# Patient Record
Sex: Male | Born: 1950 | Race: White | Hispanic: No | Marital: Married | Smoking: Former smoker
Health system: Southern US, Community
[De-identification: ages and names within clinical notes are randomized; demographics above are authoritative.]

## PROBLEM LIST (undated history)

## (undated) DIAGNOSIS — Z8639 Personal history of other endocrine, nutritional and metabolic disease: Secondary | ICD-10-CM

## (undated) DIAGNOSIS — T7840XA Allergy, unspecified, initial encounter: Secondary | ICD-10-CM

## (undated) DIAGNOSIS — I480 Paroxysmal atrial fibrillation: Secondary | ICD-10-CM

## (undated) HISTORY — PX: CARDIAC CATHETERIZATION: SHX172

## (undated) HISTORY — PX: TEE WITH CARDIOVERSION: SHX5442

## (undated) HISTORY — DX: Allergy, unspecified, initial encounter: T78.40XA

## (undated) HISTORY — DX: Paroxysmal atrial fibrillation: I48.0

## (undated) HISTORY — DX: Personal history of other endocrine, nutritional and metabolic disease: Z86.39

---

## 2007-12-07 DIAGNOSIS — I480 Paroxysmal atrial fibrillation: Secondary | ICD-10-CM

## 2007-12-07 HISTORY — DX: Paroxysmal atrial fibrillation: I48.0

## 2008-02-21 ENCOUNTER — Inpatient Hospital Stay (HOSPITAL_COMMUNITY): Admission: EM | Admit: 2008-02-21 | Discharge: 2008-02-22 | Payer: Self-pay | Admitting: Emergency Medicine

## 2008-02-22 ENCOUNTER — Encounter (INDEPENDENT_AMBULATORY_CARE_PROVIDER_SITE_OTHER): Payer: Self-pay | Admitting: *Deleted

## 2009-04-17 IMAGING — CR DG CHEST 2V
2 series · 2 of 2 positions shown · non-contrast
Comparison: none

CLINICAL DATA: Chest pain.  Dizziness.  Nonsmoker. 
 CHEST - 2 VIEW:

[w chest pa]
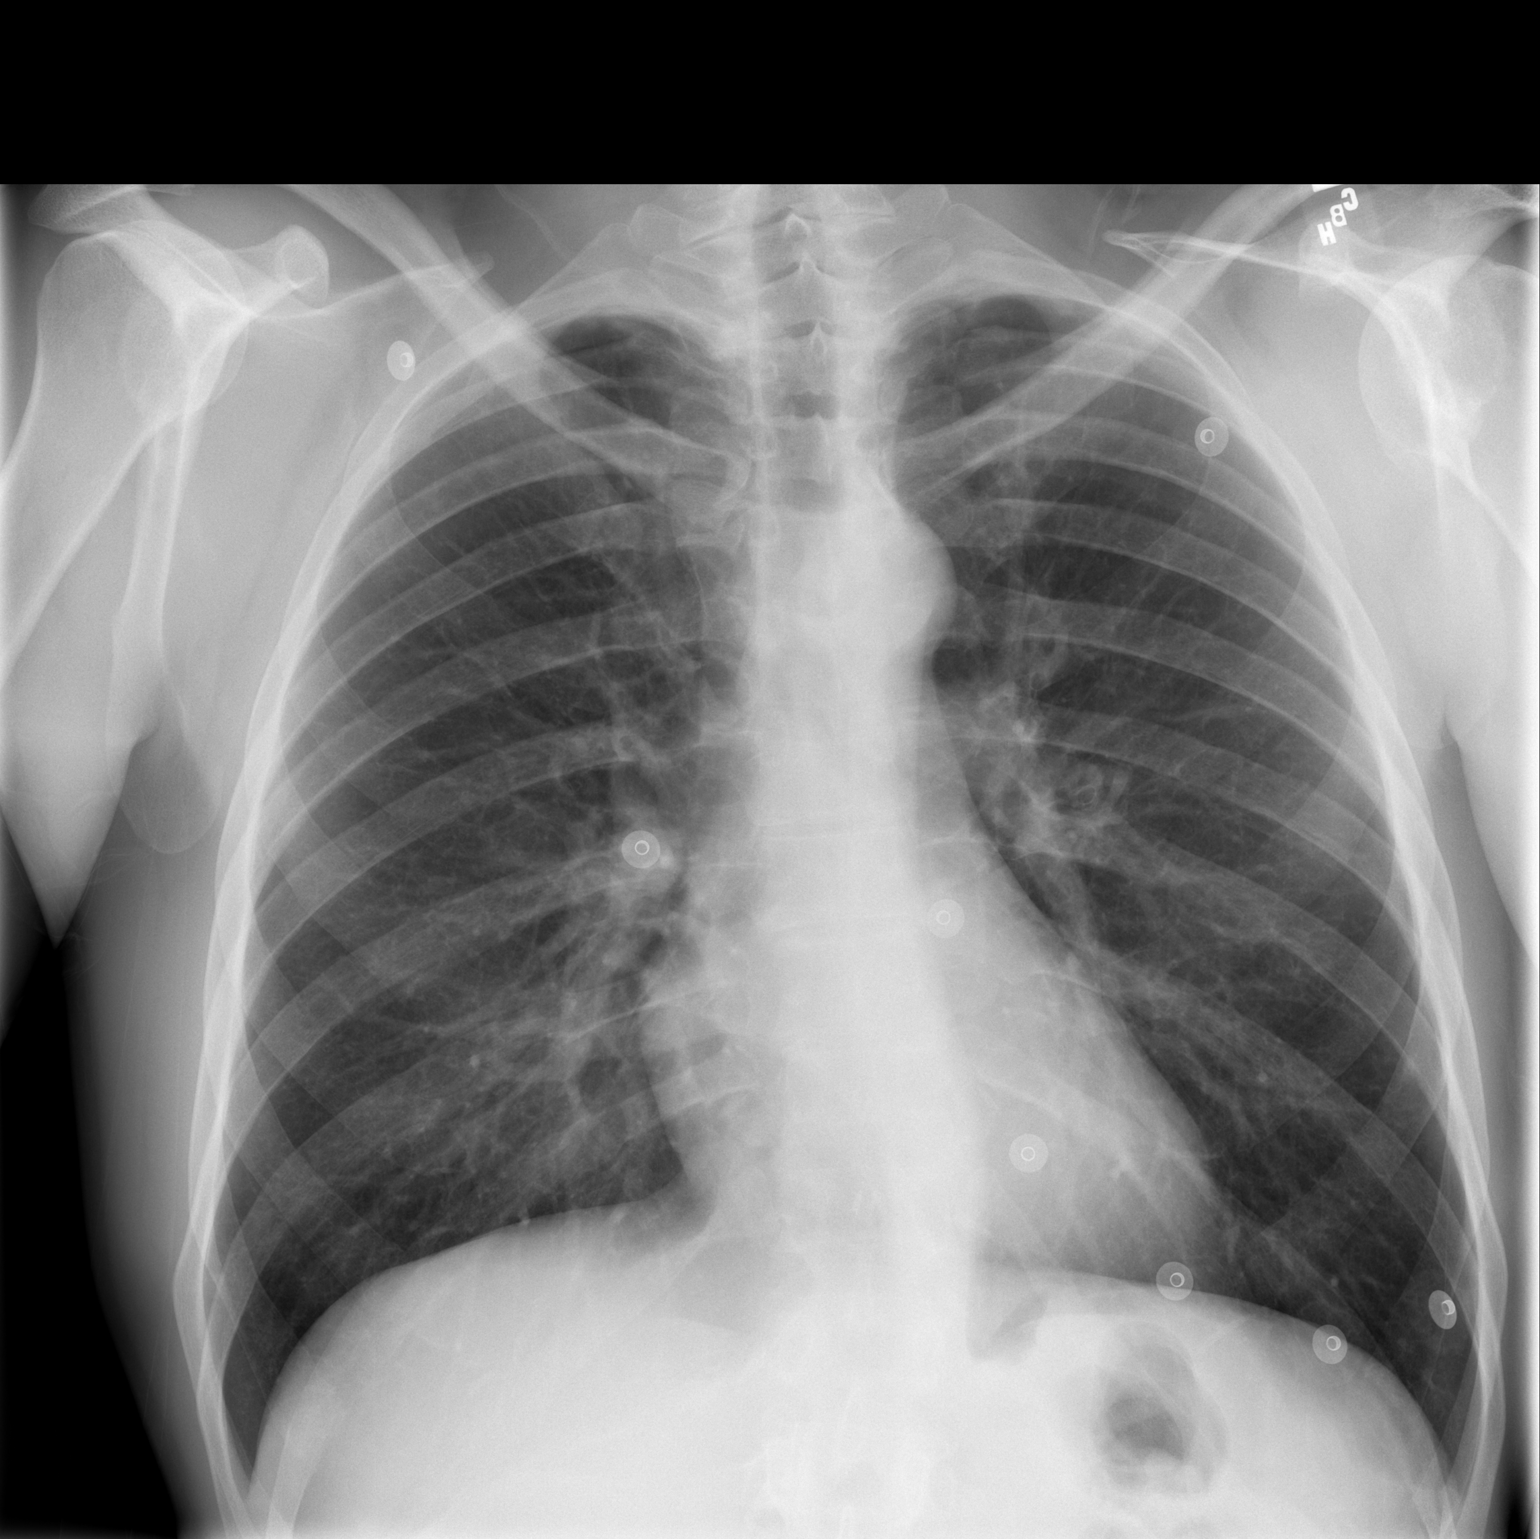

[w chest lat]
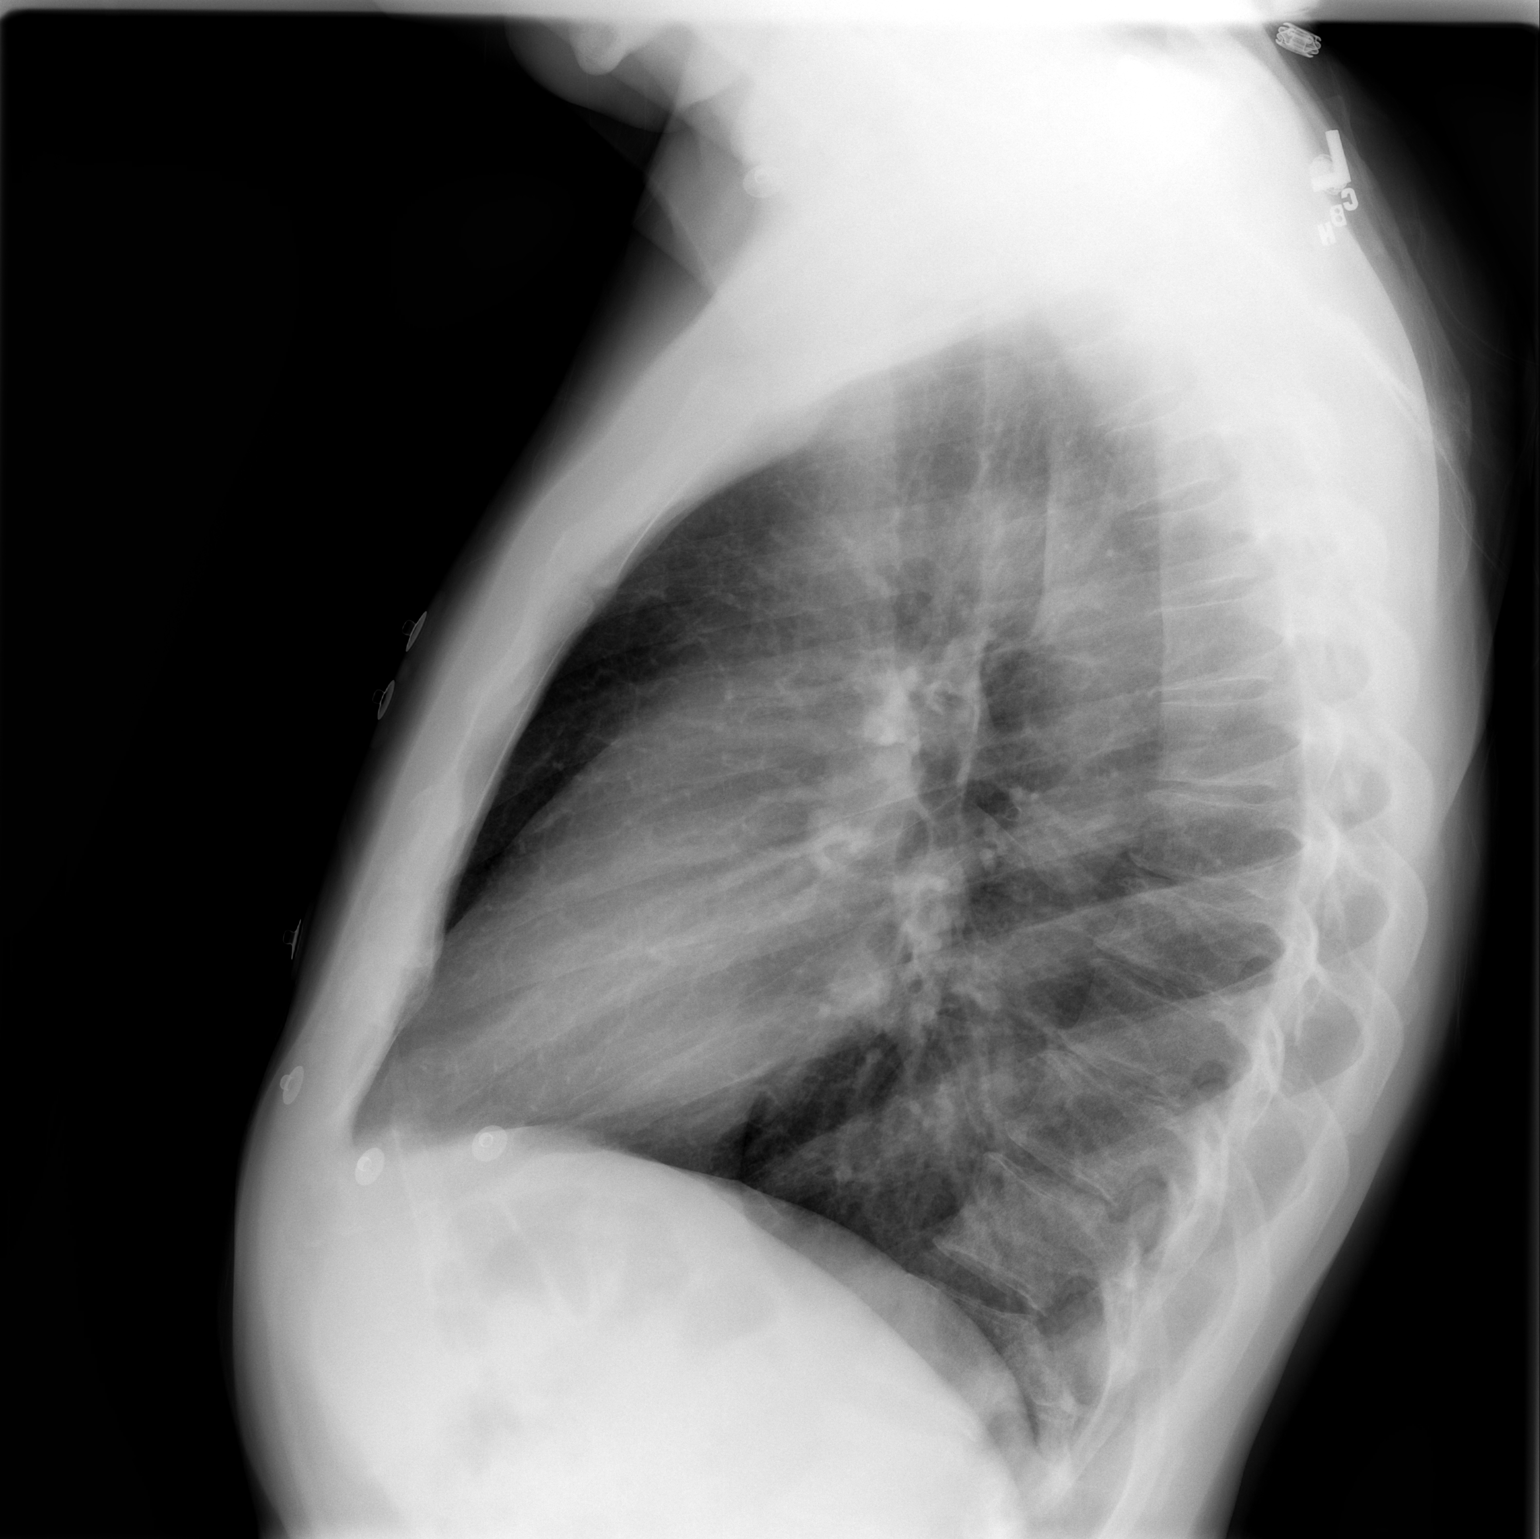

[2 of 2 positions shown; findings below may reference images not displayed]

FINDINGS: The heart size and mediastinal contours are within normal limits.  Both lungs are clear.  The visualized skeletal structures are unremarkable.
IMPRESSION: No active cardiopulmonary disease.

## 2009-04-17 IMAGING — CT CT HEAD W/O CM
1 series · 16 of 30 positions shown, 20 images · IV contrast (agent unspecified)
Comparison: None.

CLINICAL DATA: Dizziness and near syncopal episode.
 HEAD CT WITHOUT CONTRAST:
TECHNIQUE: Contiguous axial images were obtained from the base of the skull through the vertex according to standard protocol without contrast.

[Series 2: head routine 4.8 h37s · axial · 0.45mm/px · z∈[+1143,+1300]mm · 16 of 36 slices shown, 20 images]
[im 2/36  brain]
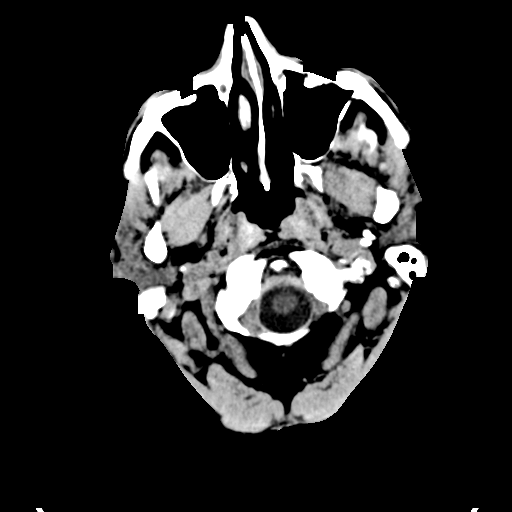
[im 2/36  bone]
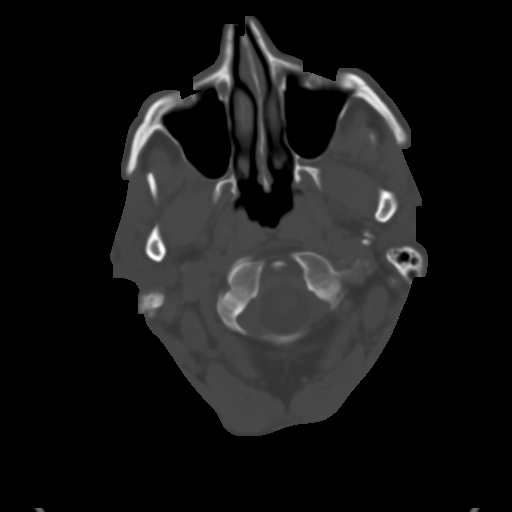
[im 4/36  brain]
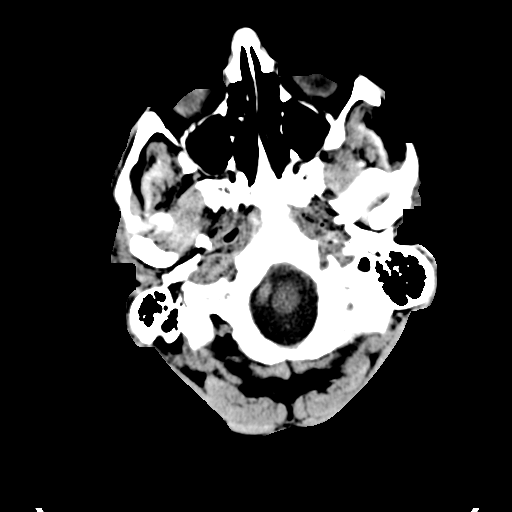
[im 7/36  brain]
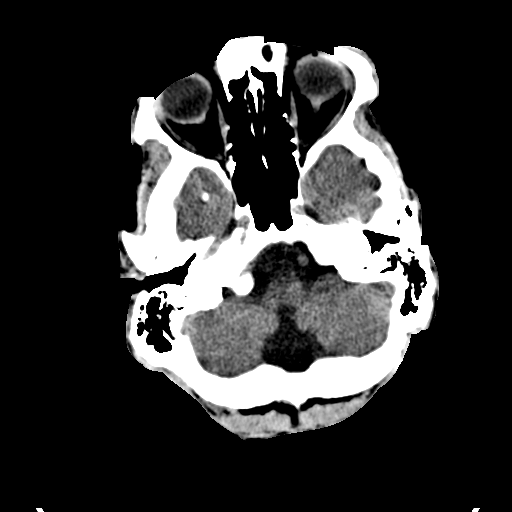
[im 9/36  brain]
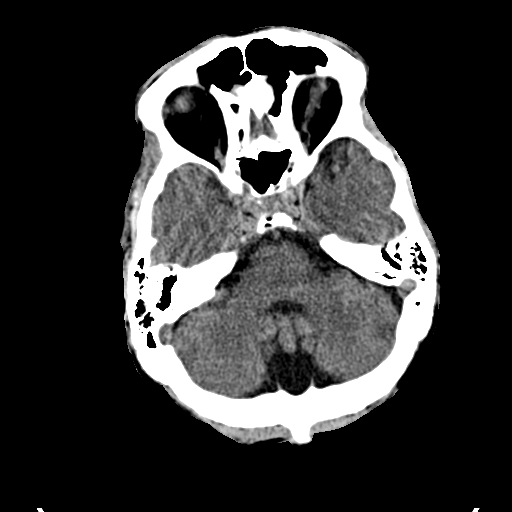
[im 10/36  brain]
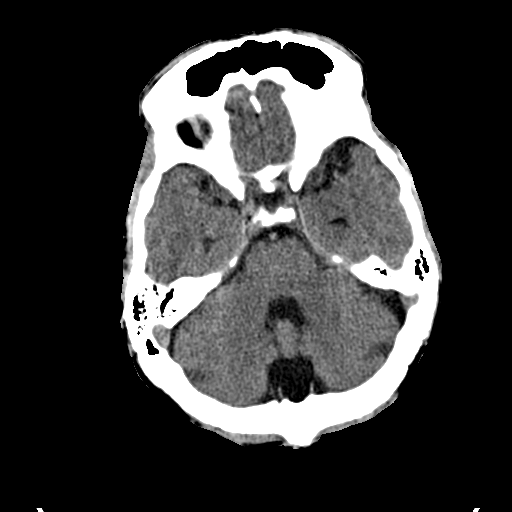
[im 10/36  bone]
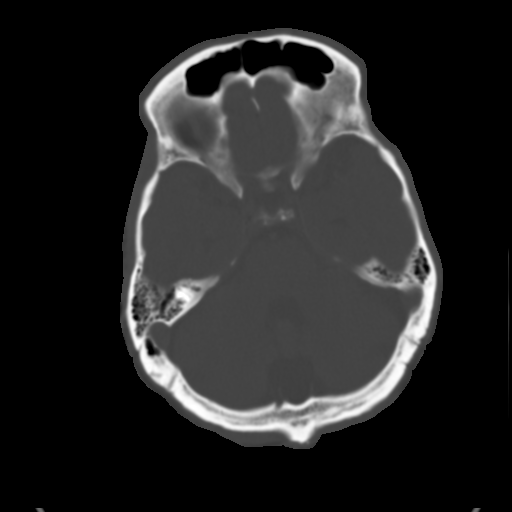
[im 13/36  brain]
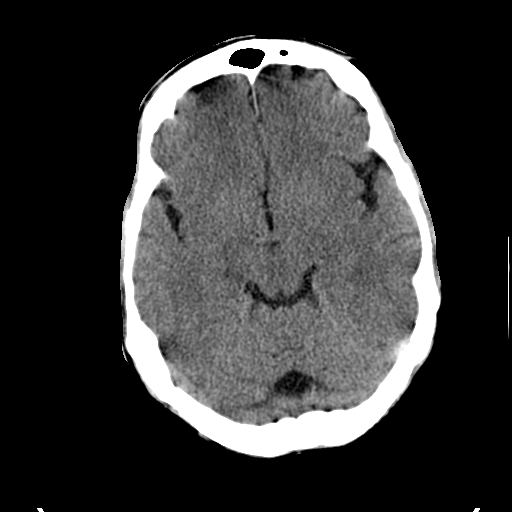
[im 15/36  brain]
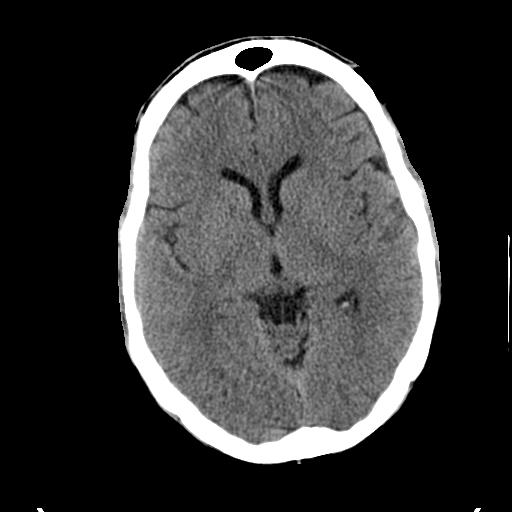
[im 17/36  brain]
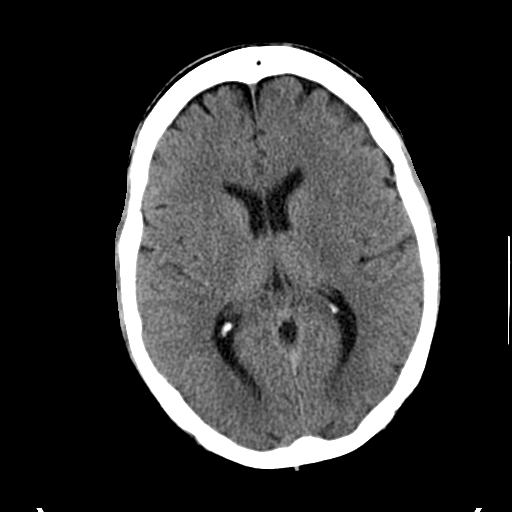
[im 19/36  brain]
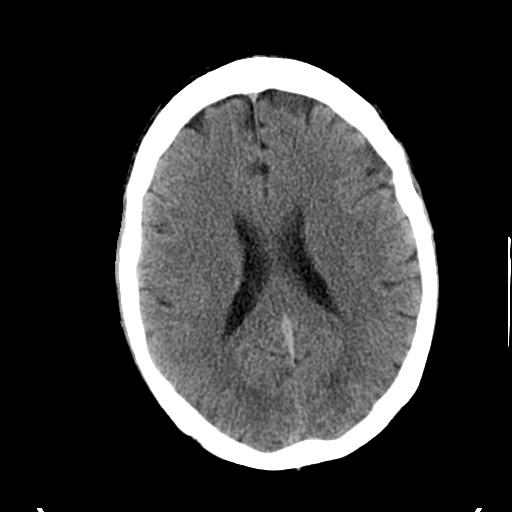
[im 19/36  bone]
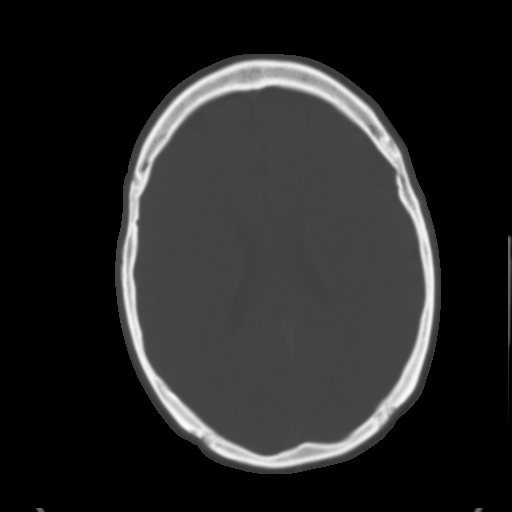
[im 21/36  brain]
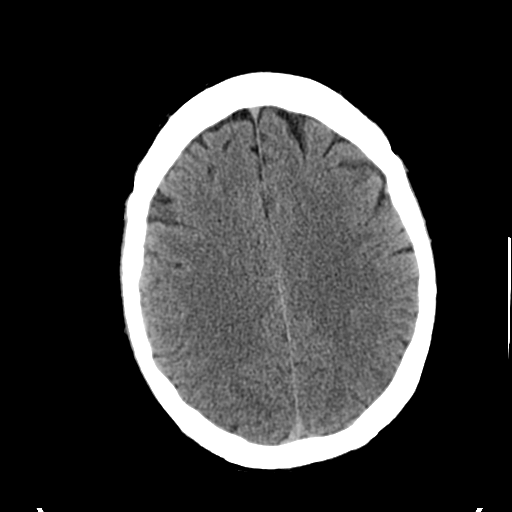
[im 23/36  brain]
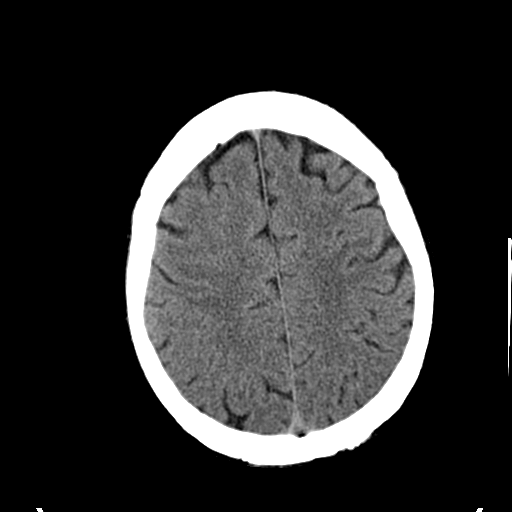
[im 26/36  brain]
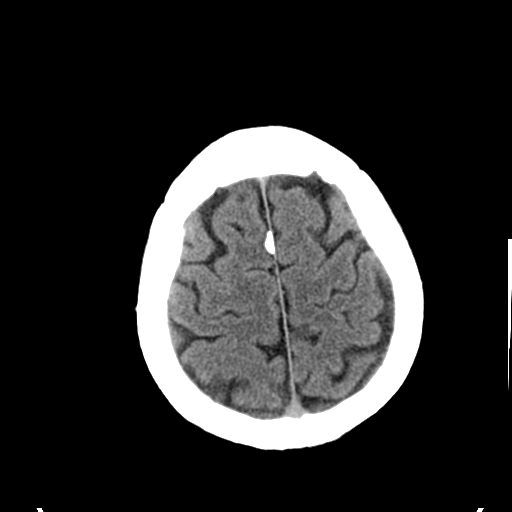
[im 27/36  brain]
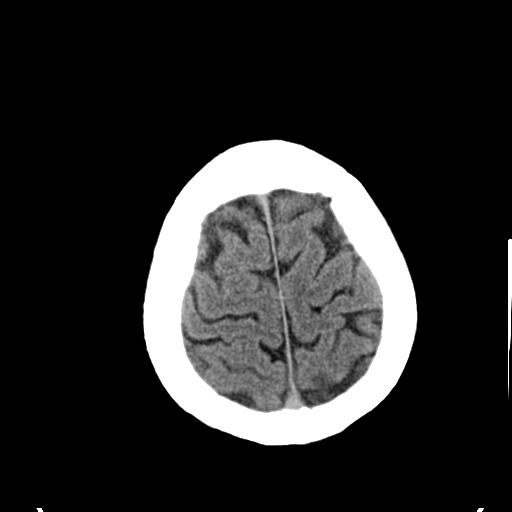
[im 27/36  bone]
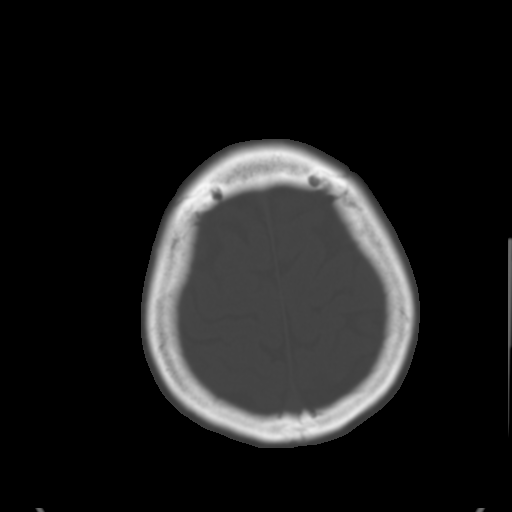
[im 29/36  brain]
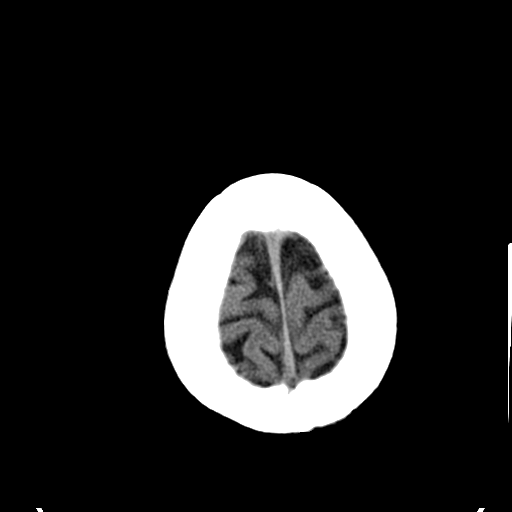
[im 32/36  brain]
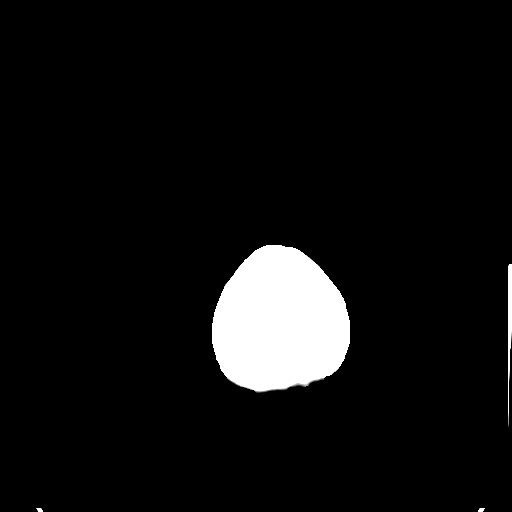
[im 34/36  brain]
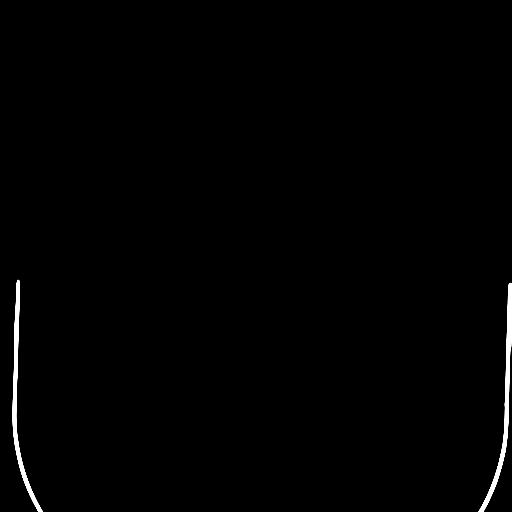

[16 of 30 positions shown; findings below may reference images not displayed]

FINDINGS: There is prominence of the sulci and ventricles consistent with brain atrophy.
 No abnormal extraaxial fluid collection, intracranial hemorrhage, or mass. 
 There are no specific features of acute infarct or hemorrhage.
 No masses or abnormal fluid collections noted.
 The paranasal sinuses and mastoid air cells are all normally aerated.
 A left frontal scalp lipoma is identified.
IMPRESSION: 1.  No acute intracranial abnormalities.
 2.  Brain atrophy.
 3.  Left frontal scalp lipoma.

## 2011-04-20 NOTE — Cardiovascular Report (Signed)
Darren Pugh, Darren Pugh                ACCOUNT NO.:  [id number]   MEDICAL RECORD NO.:  [id number]          PATIENT TYPE:  INP   LOCATION:  3735                         FACILITY:  [facility]   PHYSICIAN:  Antonieta Iba, MD   DATE OF BIRTH:  Oct 31, 1951   DATE OF PROCEDURE:  02/22/2008  DATE OF DISCHARGE:  02/22/2008                            CARDIAC CATHETERIZATION   PROCEDURES PERFORMED:  1. Left heart catheterization.  2. Coronary angiogram.   PHYSICIAN:  Antonieta Iba, MD   HISTORY:  Mr. Darren Pugh is a 60 year old gentleman with has a past  medical history of coronary artery disease presents to the [facility] for rapid atrial fibrillation, chest pain, troponin elevation  to 0.25 and periods of lightheadedness and dizziness in a setting of  atrial fibrillation.  He states that this has been going on for the past  2 days.  He last had these symptoms 1 year ago during the summer time  though without the chest discomfort.  He was brought for cardiac  catheterization for further evaluation of coronary disease.   MEDICATIONS GIVEN:  Fentanyl 25 mcg x1, Versed 1 mg IV x1.   PROCEDURE IN DETAIL:  After detailed description of the procedure,  including the risks, benefits and potential alternatives informed  consent was obtained.  The patient was premedicated with Valium 5 mg  p.o. and Benadryl 25 mg p.o. and was brought to the cardiac  catheterization lab.  The patient was prepped and draped in the usual  sterile fashion.   Local anesthesia was obtained using 1% lidocaine.  The right femoral  artery was cannulated using a modified Seldinger technique and a 6  French introducer sheath was inserted.   Selective coronary angiography was then performed using a 6 French  Judkins left #4 catheter to engage the left main coronary artery and a 6  French Judkins right #4 catheter to engage the right coronary artery.  Arteriography was performed using hand injections of contrast  in  multiple projections.  Left ventriculography was not performed.  Following the procedure the arterial sheath was removed and hemostasis  obtained.  Less than 60 mL of contrast was used for the procedure.   ANGIOGRAPHY:  1. Left main coronary artery; the left main is a moderate sized vessel      that bifurcates into the LAD (left anterior descending) and the      left circumflex.  There is no significant disease.  2. Left anterior descending; the left anterior descending is a      moderate sized vessel that has two diagonal branches.  There is 20-      30% mid LAD disease though myocardial bridging cannot be ruled out      at this location.  Otherwise, there is no significant disease of      the LAD or diagonal vessels.  3. Left circumflex artery; the left circumflex is a dominant vessel of      moderate size giving rise to large branching obtuse marginal artery      and PDA.  There is no  significant disease noted.  4. Right coronary artery; the right coronary artery is a nondominant      vessel with no significant disease noted.   CONCLUSION:  Left dominant coronary system with mild mid left anterior  descending disease of 20-30% in a relatively focal region.  Myocardial  bridging at this location could not be ruled out.  Otherwise, there is  no significant disease noted.  The etiology of his chest pain and  troponin level is likely secondary to rapid atrial fibrillation.      Antonieta Iba, MD  Electronically Signed     TJG/MEDQ  D:  02/23/2008  T:  02/23/2008  Job:  [id number]

## 2011-04-20 NOTE — H&P (Signed)
Darren Pugh, Darren Pugh                ACCOUNT NO.:  [id number]   MEDICAL RECORD NO.:  [id number]          PATIENT TYPE:  INP   LOCATION:  3735                         FACILITY:  [facility]   PHYSICIAN:  Lucita Ferrara, MD         DATE OF BIRTH:  [birth year]   DATE OF ADMISSION:  02/21/2008  DATE OF DISCHARGE:                              HISTORY & PHYSICAL   HISTORY OF PRESENT ILLNESS:  The patient is a 60 year old male presents  to [facility] with a chief complaint of shortness of  breath, dizziness and palpitations which occurred times 24 hours.  Patient's symptoms are progressively worse.  The patient apparently  states that he also blacked out and saw white star.  At the time that  these symptoms occurred, the patient was exerting himself unloading a  truck.  He  also complains of left-sided type of chest pain that is  pressure-like in intensity.  He denies alcohol intake, denies a history  of arrhythmias or coronary artery disease or cardiac dysfunction.  Otherwise, past medical history is remarkable.  He did not have symptoms  associated with gastroesophageal reflux disease.  Chest pain is not  reproducible, not associated with food.  Dizziness and blacking out  resolved.  He denies any seizure activity.  He also denies any focal  neurological deficits or deterioration in his speech or garbled speech.   SURGERIES:  None.   MEDICATIONS:  None.   ALLERGIES:  Apparently is allergic to HYDROCODONE.  Gets abnormal  behavior.   He had never seen a cardiologist nor been evaluated by cardiologist.   SOCIAL HISTORY:  Denies drugs, alcohol or tobacco.   PHYSICAL EXAMINATION:  VITAL SIGNS:  Temperature 97, blood pressure  ranging from 95/66 to 156/113, pulse ranging from 67 to 176, pulse ox  96% on room air.  HEENT:  Normocephalic, atraumatic.  Sclerae anicteric.  NECK:  Supple.  No JVD.  No carotid bruits.  Extraocular muscles intact.  CARDIOVASCULAR:  S1 and S2.  Irregularly  irregular with rapid rate.  No  murmurs, rubs or clicks.  LUNGS:  Clear to auscultation bilaterally.  No rhonchi, rales or  wheezes.  EXTREMITIES:  No clubbing, cyanosis or edema.  NEURO:  Patient is alert, oriented x3.  Cranial nerves II-XII grossly  intact.   LABORATORY DATA:  BUN is 21, creatinine 1.2.  Hemoglobin is 14.8,  hematocrit 41.5, white count 9.4,  platelets 177,000.  In the emergency  room the patient was found to be in rapid ventricular rate atrial  fibrillation.  Was started on Cardizem.  EKG shows atrial fibrillation  with rapid ventricular response of 107 ventricular rate.   ASSESSMENT/PLAN:  A 60 year old with no significant past medical history  with;  1. Atrial fibrillation with a questionable rapid ventricular rate and      also pressure-like chest pain.  Will go ahead and admit the patient      to telemetry unit for monitoring.  Rule out ischemia or acute      coronary syndrome, cardiac enzymes x3 every 8 hours.  Will check an      magnesium phosphorus and thyroid-stimulating hormone.  A 2-D      echocardiogram in the morning.  Cardiology consultation as needed.      Start aspirin for now, 325 mg p.o. daily.  Rate is now controlled      with the Cardizem that was given in the emergency room.  We will      use Lopressor p.r.n. for rate control. Also get a chest x-ray now.  2. Hypertension.  That is a new diagnosis.  May be secondary to #1.      We will watch his blood pressures for now.  Control with      Metoprolol.  3. Deep venous thrombosis prophylaxis with Lovenox and GI prophylaxis      with Protonix.   I explained plans and procedures of this admission to the patient.  The  patient understands and the patient.      Lucita Ferrara, MD  Electronically Signed     RR/MEDQ  D:  02/21/2008  T:  02/22/2008  Job:  [id number]

## 2011-04-23 NOTE — Discharge Summary (Signed)
NAMEHENDERSON, FRAMPTON                ACCOUNT NO.:  [id number]   MEDICAL RECORD NO.:  [id number]          PATIENT TYPE:  INP   LOCATION:  3735                         FACILITY:  [facility]   PHYSICIAN:  Wilson Singer, M.D.DATE OF BIRTH:  11-22-1951   DATE OF ADMISSION:  02/21/2008  DATE OF DISCHARGE:  02/22/2008                               DISCHARGE SUMMARY   FINAL DISCHARGE DIAGNOSIS:  1. Paroxysmal atrial fibrillation.  2. Myocardial infarction excluded.  3. Cardiac catheterization for which the results do not seem to be in      the chart, to be managed conservatively.   CONDITION ON DISCHARGE:  Stable.   MEDICATIONS ON DISCHARGE:  1. Aspirin 325 mg daily.  2. Metoprolol 12.5 mg daily.   HISTORY:  This is a 60 year old man was admitted with shortness of  breath, dizziness, and palpitations.  Please see initial history and  physical examination done by Dr. Ancil Boozer.   HOSPITAL PROGRESS:  The patient was admitted to telemetry and he was  seen by cardiologist.  There was no evidence of myocardial infarction.  He underwent cardiac catheterization on  February 22, 2008, which showed  mild nonsignificant coronary artery disease with mild LAD stenosis of  30% or so.  He did well postprocedure and there were no major problems.  He was able to be discharged home.  He will follow up with his  cardiologist at [facility].      Wilson Singer, M.D.  Electronically Signed     NCG/MEDQ  D:  04/15/2008  T:  04/16/2008  Job:  [id number]

## 2011-08-30 LAB — BASIC METABOLIC PANEL
BUN: 20
CO2: 24
Chloride: 108
Creatinine, Ser: 0.99
Glucose, Bld: 110 — ABNORMAL HIGH
Potassium: 3.5

## 2011-08-30 LAB — RAPID URINE DRUG SCREEN, HOSP PERFORMED
Amphetamines: NOT DETECTED
Barbiturates: NOT DETECTED
Cocaine: NOT DETECTED
Opiates: NOT DETECTED
Tetrahydrocannabinol: NOT DETECTED

## 2011-08-30 LAB — POCT I-STAT CREATININE: Creatinine, Ser: 1.2

## 2011-08-30 LAB — I-STAT 8, (EC8 V) (CONVERTED LAB)
Bicarbonate: 24.9 — ABNORMAL HIGH
Chloride: 106
HCT: 46
Hemoglobin: 15.6
Operator id: 257131
TCO2: 26
pCO2, Ven: 41.9 — ABNORMAL LOW

## 2011-08-30 LAB — POCT CARDIAC MARKERS
CKMB, poc: <1 — ABNORMAL LOW
Operator id: 257131
Troponin i, poc: <0.05

## 2011-08-30 LAB — CK TOTAL AND CKMB (NOT AT ARMC): Relative Index: INVALID

## 2011-08-30 LAB — DIFFERENTIAL
Basophils Relative: 0
Eosinophils Absolute: 0
Eosinophils Relative: 0
Neutrophils Relative %: 89 — ABNORMAL HIGH

## 2011-08-30 LAB — CBC
HCT: 41.5
MCHC: 35.5
MCV: 95.7
Platelets: 177
RDW: 12.5

## 2011-08-30 LAB — LIPID PANEL
Cholesterol: 108
LDL Cholesterol: 74
Total CHOL/HDL Ratio: 4.5
VLDL: 10

## 2011-08-30 LAB — APTT: aPTT: 28

## 2011-08-30 LAB — MAGNESIUM: Magnesium: 1.8

## 2011-08-30 LAB — PROTIME-INR: INR: 1.1

## 2011-08-30 LAB — TROPONIN I: Troponin I: 0.19 — ABNORMAL HIGH

## 2011-08-30 LAB — CALCIUM: Calcium: 8.2 — ABNORMAL LOW

## 2012-05-30 ENCOUNTER — Encounter: Payer: Self-pay | Admitting: Cardiovascular Disease

## 2012-05-31 ENCOUNTER — Ambulatory Visit: Payer: Self-pay | Admitting: Cardiovascular Disease

## 2012-06-05 ENCOUNTER — Encounter: Payer: Self-pay | Admitting: Cardiovascular Disease

## 2012-06-05 ENCOUNTER — Ambulatory Visit (INDEPENDENT_AMBULATORY_CARE_PROVIDER_SITE_OTHER): Payer: BC Managed Care – PPO | Admitting: Cardiovascular Disease

## 2012-06-05 VITALS — BP 120/80 | HR 59 | Ht 75.0 in | Wt 191.0 lb

## 2012-06-05 DIAGNOSIS — I4891 Unspecified atrial fibrillation: Secondary | ICD-10-CM

## 2012-06-05 DIAGNOSIS — I480 Paroxysmal atrial fibrillation: Secondary | ICD-10-CM | POA: Insufficient documentation

## 2012-06-05 DIAGNOSIS — I2510 Atherosclerotic heart disease of native coronary artery without angina pectoris: Secondary | ICD-10-CM | POA: Insufficient documentation

## 2012-06-05 NOTE — Patient Instructions (Addendum)
You are doing well. No medication changes were made.  Please call us if you have new issues that need to be addressed before your next appt.  Your physician wants you to follow-up in: 12 months.  You will receive a reminder letter in the mail two months in advance. If you don't receive a letter, please call our office to schedule the follow-up appointment. 

## 2012-06-05 NOTE — Assessment & Plan Note (Signed)
Currently with no symptoms of angina. No further workup at this time. Continue current medication regimen. 
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
## 2012-06-05 NOTE — Assessment & Plan Note (Signed)
No indication of further episodes of atrial fibrillation.  clinically is doing well. Many findings on exam. No testing at this time .

## 2012-10-02 ENCOUNTER — Ambulatory Visit (INDEPENDENT_AMBULATORY_CARE_PROVIDER_SITE_OTHER): Payer: BC Managed Care – PPO | Admitting: Family Medicine

## 2012-10-02 VITALS — BP 138/86 | HR 66 | Temp 97.8°F | Resp 20 | Ht 74.0 in | Wt 193.6 lb

## 2012-10-02 DIAGNOSIS — Z0000 Encounter for general adult medical examination without abnormal findings: Secondary | ICD-10-CM

## 2012-10-02 NOTE — Progress Notes (Signed)
[facility] [address] [place] [address] [phone]  Date:  10/02/2012   Name:  Darren Pugh   DOB:  1951/09/28   MRN:  [id number]  PCP:  No primary provider on file.    Chief Complaint: Employment Physical   History of Present Illness:  Darren Pugh is a 61 y.o. very pleasant male patient who presents with the following:  History of A fib, CP and elevated troponin in the past.  He had a heart cath in 209 which showed mild CAD but he never had a stent or other procedure and was not diagnosed with an MI.  He spontaneously converted to sinus rhythm.  He recently saw Dr. Mariah Milling in July of this year to establish care and is being followed.   He is on metoprolol at this time. It is not clear if this is for BP control or cardioprotection.  Otherwise, his DOT history form is negative except he does drink about 6 beers a day and has done so for some time.   He has never had a DUI.  He does not need to drink during the day to steady his nerves.  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 PAF (paroxysmal atrial fibrillation)  . CAD (coronary artery disease)    Past Medical History  Diagnosis Date  . PAF (paroxysmal atrial fibrillation) 2009  . History of hyperlipidemia     Past Surgical History  Procedure Date  . Tee with cardioversion   . Cardiac catheterization     30%LAD;non-critical mid;normal dominant circumflex;normal nondominant right.    History  Substance Use Topics  . Smoking status: Former Smoker    Types: Cigars    Quit date: 10/02/1997  . Smokeless tobacco: Not on file  . Alcohol Use: No    Family History  Problem Relation Age of Onset  . Arrhythmia Mother     No Known Allergies  Medication list has been reviewed and updated.  Current Outpatient Prescriptions on File Prior  to Visit  Medication Sig Dispense Refill  . metoprolol tartrate (LOPRESSOR) 12.5 mg TABS Take 12.5 mg by mouth at bedtime.       . Multiple Vitamin (MULTIVITAMIN) tablet Take 1 tablet by mouth daily.      Marland Kitchen omeprazole (PRILOSEC) 20 MG capsule Take 20 mg by mouth as needed.        Review of Systems:  As per HPI- otherwise negative.   Physical Examination: Filed Vitals:   10/02/12 1226  BP: 154/100  Pulse: 66  Temp: 97.8 F (36.6 C)  Resp: 20   Filed Vitals:   10/02/12 1226  Height: 6\' 2"  (1.88 m)  Weight: 193 lb 9.6 oz (87.816 kg)   Body mass index is 24.86 kg/(m^2). Ideal Body Weight: Weight in (lb) to have BMI = 25: 194.3   GEN: WDWN, NAD, Non-toxic, A & O x 3- no sign of intoxication or alcohol withdrawal.  HEENT: Atraumatic, Normocephalic. Neck supple. No masses, No LAD.  Bilateral TM wnl, oropharynx normal.  PEERL,EOMI.   Ears and Nose: No external deformity. CV: RRR, No M/G/R. No JVD. No thrill. No extra heart sounds. PULM: CTA B, no wheezes, crackles, rhonchi. No retractions. No resp. distress. No accessory muscle use. ABD: S, NT, ND, +BS. No rebound. No  HSM. EXTR: No c/c/e NEURO Normal gait.  GU: no inguinal hernia PSYCH: Normally interactive. Conversant. Not depressed or anxious appearing.  Calm demeanor.    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  Explained that he is drinking too much for his health and encouraged him to cut down with a goal of not more than 2 drinks a day.  He does not drink before or during work.  He does not show any physical signs of intoxication or withdrawal.    Also explained that he has trace blood in his urine.  He is not a smoker.  Recommended that he have this rechecked in 1 or 2 months.   Abbe Amsterdam, MD

## 2013-06-12 ENCOUNTER — Other Ambulatory Visit: Payer: Self-pay

## 2013-06-12 MED ORDER — METOPROLOL TARTRATE 12.5 MG HALF TABLET: 12.5000 mg | ORAL_TABLET | Freq: Every day | ORAL | Status: DC

## 2013-06-12 NOTE — Telephone Encounter (Signed)
Pt has appt for tomorrow, and is completely out of this med

## 2013-06-12 NOTE — Telephone Encounter (Signed)
Refilled Metoprolol sent to [facility]. Appointment 06/13/13.

## 2013-06-13 ENCOUNTER — Encounter: Payer: Self-pay | Admitting: Cardiovascular Disease

## 2013-06-13 ENCOUNTER — Ambulatory Visit (INDEPENDENT_AMBULATORY_CARE_PROVIDER_SITE_OTHER): Payer: BC Managed Care – PPO | Admitting: Cardiovascular Disease

## 2013-06-13 VITALS — BP 116/78 | HR 61 | Ht 74.0 in | Wt 196.5 lb

## 2013-06-13 DIAGNOSIS — I4891 Unspecified atrial fibrillation: Secondary | ICD-10-CM

## 2013-06-13 DIAGNOSIS — R42 Dizziness and giddiness: Secondary | ICD-10-CM | POA: Insufficient documentation

## 2013-06-13 DIAGNOSIS — I480 Paroxysmal atrial fibrillation: Secondary | ICD-10-CM

## 2013-06-13 DIAGNOSIS — I2510 Atherosclerotic heart disease of native coronary artery without angina pectoris: Secondary | ICD-10-CM

## 2013-06-13 MED ORDER — METOPROLOL TARTRATE 25 MG PO TABS: 25.0000 mg | ORAL_TABLET | Freq: Two times a day (BID) | ORAL | Status: DC | PRN

## 2013-06-13 NOTE — Assessment & Plan Note (Signed)
By his history, does not sound that he is having episodes of paroxysmal atrial fibrillation. He may have short runs of sinus tachycardia if he stands up too quickly. I've asked him to monitor his heart rate. If it is regular, he could be dehydrated. He drives in a very hot truck. Blood pressure could be low. He'll monitor his blood pressure with his cuff. If blood pressure continues to run low, he could hold his metoprolol. We did offer today Holter monitor if he does have episodes of worsening tachycardia.

## 2013-06-13 NOTE — Assessment & Plan Note (Signed)
I suspect this is from orthostasis. 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 If symptoms persist, he may need to hold his metoprolol, consider compression hose.

## 2013-06-13 NOTE — Progress Notes (Signed)
Patient ID: Darren Pugh, male    DOB: Aug 20, 1951, 62 y.o.   MRN: [id number]  HPI Comments: Darren Pugh is a very pleasant 62 year old gentleman with a remote history of atrial fibrillation and chest pain with evaluation in 2009, cardiac catheterization at that time showing 30% LAD disease, TEE with spontaneous conversion to normal sinus rhythm in the emergency room. Intermittent palpitations for 5-7 years. He presents for routine followup.  He reports having rare episodes of dizziness. Typically his presents when he stands up after being seated for a long period of time. He drives a truck. Sometimes after a long drive, he stands up, he feels dizzy. His wife was having him take extra metoprolol wondering if this could be in atrial fibrillation causing his dizziness. He denies any  palpitations but not on a regular basis. Rarely has fast heart rate. He is unsure if this is normal rhythm or atrial fibrillation. He does not check his pulse. Symptoms do not last for long and if he sits down, they resolve relatively quickly. Dizzy episodes resolve with sitting down.  He denies any significant shortness of breath, edema, chest pain. No palpitations concerning for arrhythmia. He denies any new symptoms and typically works a full day.  Previous lab work shows total cholesterol 119, LDL 75, HDL 28.  Normal CBC and basic metabolic panel, TSH 1.56, PSA 5.63  EKG today shows normal sinus rhythm with rate 61 beats per minute, no significant ST or T wave changes   Outpatient Encounter Prescriptions as of 06/13/2013  Medication Sig Dispense Refill  . metoprolol tartrate (LOPRESSOR) 25 MG tablet Take 1 tablet (25 mg total) by mouth 2 (two) times daily as needed.  60 tablet  3  . Multiple Vitamin (MULTIVITAMIN) tablet Take 1 tablet by mouth daily.      Marland Kitchen omeprazole (PRILOSEC) 20 MG capsule Take 20 mg by mouth as needed.      . [DISCONTINUED] metoprolol tartrate (LOPRESSOR) 12.5 mg TABS Take 0.5 tablets (12.5 mg  total) by mouth at bedtime.  30 tablet  1   No facility-administered encounter medications on file as of 06/13/2013.    Review of Systems  Constitutional: Negative.   HENT: Negative.   Eyes: Negative.   Respiratory: Negative.   Cardiovascular: Positive for palpitations.  Gastrointestinal: Negative.   Musculoskeletal: Negative.   Skin: Negative.   Neurological: Positive for dizziness.  Psychiatric/Behavioral: Negative.   All other systems reviewed and are negative.    BP 116/78  Pulse 61  Ht 6\' 2"  (1.88 m)  Wt 196 lb 8 oz (89.132 kg)  BMI 25.22 kg/m2  Physical Exam  Nursing note and vitals reviewed. Constitutional: He is oriented to person, place, and time. He appears well-developed and well-nourished.  HENT:  Head: Normocephalic.  Nose: Nose normal.  Mouth/Throat: Oropharynx is clear and moist.  Eyes: Conjunctivae are normal. Pupils are equal, round, and reactive to light.  Neck: Normal range of motion. Neck supple. No JVD present.  Cardiovascular: Normal rate, regular rhythm, S1 normal, S2 normal, normal heart sounds and intact distal pulses.  Exam reveals no gallop and no friction rub.   No murmur heard. Pulmonary/Chest: Effort normal and breath sounds normal. No respiratory distress. He has no wheezes. He has no rales. He exhibits no tenderness.  Abdominal: Soft. Bowel sounds are normal. He exhibits no distension. There is no tenderness.  Musculoskeletal: Normal range of motion. He exhibits no edema and no tenderness.  Lymphadenopathy:    He has no  cervical adenopathy.  Neurological: He is alert and oriented to person, place, and time. Coordination normal.  Skin: Skin is warm and dry. No rash noted. No erythema.  Psychiatric: He has a normal mood and affect. His behavior is normal. Judgment and thought content normal.      Assessment and Plan

## 2013-06-13 NOTE — Assessment & Plan Note (Signed)
Minimal CAD by previous cardiac catheterization at outside facility. Cholesterol has been low in the past.

## 2013-06-13 NOTE — Patient Instructions (Addendum)
You are doing well. Please hold the metoprolol as take only as needed  Please call us if you have new issues that need to be addressed before your next appt.  Your physician wants you to follow-up in: 12 months.  You will receive a reminder letter in the mail two months in advance. If you don't receive a letter, please call our office to schedule the follow-up appointment.

## 2014-09-02 ENCOUNTER — Ambulatory Visit (INDEPENDENT_AMBULATORY_CARE_PROVIDER_SITE_OTHER): Payer: BC Managed Care – PPO | Admitting: Cardiovascular Disease

## 2014-09-02 ENCOUNTER — Encounter: Payer: Self-pay | Admitting: Cardiovascular Disease

## 2014-09-02 VITALS — BP 102/72 | HR 57 | Ht 74.0 in | Wt 194.2 lb

## 2014-09-02 DIAGNOSIS — R42 Dizziness and giddiness: Secondary | ICD-10-CM

## 2014-09-02 DIAGNOSIS — I480 Paroxysmal atrial fibrillation: Secondary | ICD-10-CM

## 2014-09-02 DIAGNOSIS — I4891 Unspecified atrial fibrillation: Secondary | ICD-10-CM

## 2014-09-02 DIAGNOSIS — I2510 Atherosclerotic heart disease of native coronary artery without angina pectoris: Secondary | ICD-10-CM

## 2014-09-02 NOTE — Patient Instructions (Signed)
You are doing well. No medication changes were made.  Please call us if you have new issues that need to be addressed before your next appt.  Your physician wants you to follow-up in: 12 months.  You will receive a reminder letter in the mail two months in advance. If you don't receive a letter, please call our office to schedule the follow-up appointment. 

## 2014-09-02 NOTE — Assessment & Plan Note (Addendum)
He denies any palpitations concerning for arrhythmia. He continues to take very low-dose metoprolol

## 2014-09-02 NOTE — Progress Notes (Signed)
   Patient ID: Darren Pugh, male    DOB: 27-Aug-1951, 63 y.o.   MRN: [id number]  HPI Comments: Mr. Armwood is a very pleasant 63 year old gentleman with a remote history of atrial fibrillation and chest pain with evaluation in 2009, cardiac catheterization at that time showing 30% LAD disease, TEE with spontaneous conversion to normal sinus rhythm in the emergency room. Intermittent palpitations for 5-7 years. He presents for routine followup.  On previous visits, had rare episodes of dizziness. 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 in followup today he reports that he feels better. He denies any significant lightheadedness. He is driving less, also getting out of the truck more .  He denies any  palpitations but not on a regular basis. No symptoms concerning for arrhythmia  He denies any significant shortness of breath, edema, chest pain. No palpitations concerning for arrhythmia. He denies any new symptoms and typically works a full day.  Previous lab work shows total cholesterol 119, LDL 75, HDL 28.  Normal CBC and basic metabolic panel, TSH 1.56, PSA 4.09  EKG today shows normal sinus rhythm with rate 57 beats per minute, no significant ST or T wave changes   Outpatient Encounter Prescriptions as of 09/02/2014  Medication Sig  . metoprolol tartrate (LOPRESSOR) 25 MG tablet Take 1 tablet (25 mg total) by mouth 2 (two) times daily as needed.  . Multiple Vitamin (MULTIVITAMIN) tablet Take 1 tablet by mouth daily.  Marland Kitchen omeprazole (PRILOSEC) 20 MG capsule Take 20 mg by mouth as needed.    Review of Systems  Constitutional: Negative.   HENT: Negative.   Eyes: Negative.   Respiratory: Negative.   Cardiovascular: Positive for palpitations.  Gastrointestinal: Negative.   Musculoskeletal: Negative.   Skin: Negative.   Neurological: Positive for dizziness.  Psychiatric/Behavioral: Negative.   All other systems  reviewed and are negative.   BP 102/72  Ht  (1.88 m)  Wt 194 lb 4 oz (88.111 kg)  BMI 24.93 kg/m2  Physical Exam  Nursing note and vitals reviewed. Constitutional: He is oriented to person, place, and time. He appears well-developed and well-nourished.  HENT:  Head: Normocephalic.  Nose: Nose normal.  Mouth/Throat: Oropharynx is clear and moist.  Eyes: Conjunctivae are normal. Pupils are equal, round, and reactive to light.  Neck: Normal range of motion. Neck supple. No JVD present.  Cardiovascular: Normal rate, regular rhythm, S1 normal, S2 normal, normal heart sounds and intact distal pulses.  Exam reveals no gallop and no friction rub.   No murmur heard. Pulmonary/Chest: Effort normal and breath sounds normal. No respiratory distress. He has no wheezes. He has no rales. He exhibits no tenderness.  Abdominal: Soft. Bowel sounds are normal. He exhibits no distension. There is no tenderness.  Musculoskeletal: Normal range of motion. He exhibits no edema and no tenderness.  Lymphadenopathy:    He has no cervical adenopathy.  Neurological: He is alert and oriented to person, place, and time. Coordination normal.  Skin: Skin is warm and dry. No rash noted. No erythema.  Psychiatric: He has a normal mood and affect. His behavior is normal. Judgment and thought content normal.      Assessment and Plan

## 2014-09-02 NOTE — Assessment & Plan Note (Addendum)
Metoprolol dose was cut back secondary to dizziness in the past. Currently dizziness has resolved. 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

## 2014-09-02 NOTE — Assessment & Plan Note (Signed)
Currently with no symptoms of angina. No further workup at this time. Continue current medication regimen. 

## 2014-11-07 ENCOUNTER — Other Ambulatory Visit: Payer: Self-pay | Admitting: Cardiovascular Disease

## 2015-10-16 ENCOUNTER — Ambulatory Visit (INDEPENDENT_AMBULATORY_CARE_PROVIDER_SITE_OTHER): Payer: BLUE CROSS/BLUE SHIELD | Admitting: Emergency Medicine

## 2015-10-16 VITALS — BP 128/82 | HR 64 | Temp 97.6°F | Resp 16 | Ht 73.5 in | Wt 201.2 lb

## 2015-10-16 DIAGNOSIS — I10 Essential (primary) hypertension: Secondary | ICD-10-CM

## 2015-10-16 LAB — LIPID PANEL
Cholesterol: 123 mg/dL — ABNORMAL LOW (ref 125–200)
HDL: 31 mg/dL — ABNORMAL LOW (ref >=40)
LDL CALC: 79 mg/dL (ref <130)
TRIGLYCERIDES: 64 mg/dL (ref <150)
Total CHOL/HDL Ratio: 4 Ratio (ref <=5.0)
VLDL: 13 mg/dL (ref <30)

## 2015-10-16 LAB — COMPREHENSIVE METABOLIC PANEL
ALBUMIN: 3.9 g/dL (ref 3.6–5.1)
ALT: 20 U/L (ref 9–46)
AST: 17 U/L (ref 10–35)
Alkaline Phosphatase: 39 U/L — ABNORMAL LOW (ref 40–115)
BUN: 12 mg/dL (ref 7–25)
CO2: 26 mmol/L (ref 20–31)
Calcium: 8.8 mg/dL (ref 8.6–10.3)
Chloride: 104 mmol/L (ref 98–110)
Creat: 0.85 mg/dL (ref 0.70–1.25)
Glucose, Bld: 99 mg/dL (ref 65–99)
Potassium: 4.7 mmol/L (ref 3.5–5.3)
SODIUM: 138 mmol/L (ref 135–146)
TOTAL PROTEIN: 6.3 g/dL (ref 6.1–8.1)
Total Bilirubin: 1.1 mg/dL (ref 0.2–1.2)

## 2015-10-16 NOTE — Patient Instructions (Signed)
Hypertension Hypertension, commonly called high blood pressure, is when the force of blood pumping through your arteries is too strong. Your arteries are the blood vessels that carry blood from your heart throughout your body. A blood pressure reading consists of a higher number over a lower number, such as 110/72. The higher number (systolic) is the pressure inside your arteries when your heart pumps. The lower number (diastolic) is the pressure inside your arteries when your heart relaxes. Ideally you want your blood pressure below 120/80. Hypertension forces your heart to work harder to pump blood. Your arteries may become narrow or stiff. Having untreated or uncontrolled hypertension can cause heart attack, stroke, kidney disease, and other problems. RISK FACTORS Some risk factors for high blood pressure are controllable. Others are not.  Risk factors you cannot control include:   Race. You may be at higher risk if you are African American.  Age. Risk increases with age.  Gender. Men are at higher risk than women before age 45 years. After age 65, women are at higher risk than men. Risk factors you can control include:  Not getting enough exercise or physical activity.  Being overweight.  Getting too much fat, sugar, calories, or salt in your diet.  Drinking too much alcohol. SIGNS AND SYMPTOMS Hypertension does not usually cause signs or symptoms. Extremely high blood pressure (hypertensive crisis) may cause headache, anxiety, shortness of breath, and nosebleed. DIAGNOSIS To check if you have hypertension, your health care provider will measure your blood pressure while you are seated, with your arm held at the level of your heart. It should be measured at least twice using the same arm. Certain conditions can cause a difference in blood pressure between your right and left arms. A blood pressure reading that is higher than normal on one occasion does not mean that you need treatment. If  it is not clear whether you have high blood pressure, you may be asked to return on a different day to have your blood pressure checked again. Or, you may be asked to monitor your blood pressure at home for 1 or more weeks. TREATMENT Treating high blood pressure includes making lifestyle changes and possibly taking medicine. Living a healthy lifestyle can help lower high blood pressure. You may need to change some of your habits. Lifestyle changes may include:  Following the DASH diet. This diet is high in fruits, vegetables, and whole grains. It is low in salt, red meat, and added sugars.  Keep your sodium intake below 2,300 mg per day.  Getting at least 30-45 minutes of aerobic exercise at least 4 times per week.  Losing weight if necessary.  Not smoking.  Limiting alcoholic beverages.  Learning ways to reduce stress. Your health care provider may prescribe medicine if lifestyle changes are not enough to get your blood pressure under control, and if one of the following is true:  You are 18-59 years of age and your systolic blood pressure is above 140.  You are 60 years of age or older, and your systolic blood pressure is above 150.  Your diastolic blood pressure is above 90.  You have diabetes, and your systolic blood pressure is over 140 or your diastolic blood pressure is over 90.  You have kidney disease and your blood pressure is above 140/90.  You have heart disease and your blood pressure is above 140/90. Your personal target blood pressure may vary depending on your medical conditions, your age, and other factors. HOME CARE INSTRUCTIONS    Have your blood pressure rechecked as directed by your health care provider.   Take medicines only as directed by your health care provider. Follow the directions carefully. Blood pressure medicines must be taken as prescribed. The medicine does not work as well when you skip doses. Skipping doses also puts you at risk for  problems.  Do not smoke.   Monitor your blood pressure at home as directed by your health care provider. SEEK MEDICAL CARE IF:   You think you are having a reaction to medicines taken.  You have recurrent headaches or feel dizzy.  You have swelling in your ankles.  You have trouble with your vision. SEEK IMMEDIATE MEDICAL CARE IF:  You develop a severe headache or confusion.  You have unusual weakness, numbness, or feel faint.  You have severe chest or abdominal pain.  You vomit repeatedly.  You have trouble breathing. MAKE SURE YOU:   Understand these instructions.  Will watch your condition.  Will get help right away if you are not doing well or get worse.   This information is not intended to replace advice given to you by your health care provider. Make sure you discuss any questions you have with your health care provider.   Document Released: 11/22/2005 Document Revised: 04/08/2015 Document Reviewed: 09/14/2013 Elsevier Interactive Patient Education 2016 Elsevier Inc.  

## 2015-10-16 NOTE — Progress Notes (Signed)
Subjective:  Patient ID: Darren PollackPhilip E Pugh, male    DOB: 10/06/1951  Age: 64 y.o. MRN: [id number]  CC: paperwork   HPI Darren Pugh presents  he is given today for completion of some [employer] and lab form for his insurance company. He is fasting. He has no acute complaint. He does have hypertension is under treatment but he only takes his old sporadically. Says makes him tired  History Darren Pugh has a past medical history of PAF (paroxysmal atrial fibrillation) (HCC) (2009); History of hyperlipidemia; and Allergy.   He has past surgical history that includes TEE with cardioversion and Cardiac catheterization.   His  family history includes Arrhythmia in his mother.  He   reports that he quit smoking about 18 years ago. His smoking use included Cigars. He does not have any smokeless tobacco history on file. He reports that he does not drink alcohol or use illicit drugs.  Outpatient Prescriptions Prior to Visit  Medication Sig Dispense Refill  . metoprolol tartrate (LOPRESSOR) 25 MG tablet Take 12.5 mg by mouth daily.    . metoprolol tartrate (LOPRESSOR) 25 MG tablet Take 0.5 tablets (12.5 mg total) by mouth at bedtime. 30 tablet 3  . Multiple Vitamin (MULTIVITAMIN) tablet Take 1 tablet by mouth daily.    Marland Kitchen. omeprazole (PRILOSEC) 20 MG capsule Take 20 mg by mouth as needed.     No facility-administered medications prior to visit.    Social History   Social History  . Marital Status: Married    Spouse Name: N/A  . Number of Children: N/A  . Years of Education: N/A   Social History Main Topics  . Smoking status: Former Smoker    Types: Cigars    Quit date: 10/02/1997  . Smokeless tobacco: None  . Alcohol Use: No  . Drug Use: No  . Sexual Activity: Not Asked   Other Topics Concern  . None   Social History Narrative     Review of Systems  Constitutional: Negative for fever, chills and appetite change.  HENT: Negative for congestion, ear pain, postnasal drip, sinus  pressure and sore throat.   Eyes: Negative for pain and redness.  Respiratory: Negative for cough, shortness of breath and wheezing.   Cardiovascular: Negative for leg swelling.  Gastrointestinal: Negative for nausea, vomiting, abdominal pain, diarrhea, constipation and blood in stool.  Endocrine: Negative for polyuria.  Genitourinary: Negative for dysuria, urgency, frequency and flank pain.  Musculoskeletal: Negative for gait problem.  Skin: Negative for rash.  Neurological: Negative for weakness and headaches.  Psychiatric/Behavioral: Negative for confusion and decreased concentration. The patient is not nervous/anxious.     Objective:  BP 128/82 mmHg  Pulse 64  Temp(Src) 97.6 F (36.4 C) (Oral)  Resp 16  Ht 6' 1.5" (1.867 m)  Wt 201 lb 3.2 oz (91.264 kg)  BMI 26.18 kg/m2  SpO2 97%  Physical Exam  Constitutional: He is oriented to person, place, and time. He appears well-developed and well-nourished. No distress.  HENT:  Head: Normocephalic and atraumatic.  Right Ear: External ear normal.  Left Ear: External ear normal.  Nose: Nose normal.  Eyes: Conjunctivae and EOM are normal. Pupils are equal, round, and reactive to light. No scleral icterus.  Neck: Normal range of motion. Neck supple. No tracheal deviation present.  Cardiovascular: Normal rate, regular rhythm and normal heart sounds.   Pulmonary/Chest: Effort normal. No respiratory distress. He has no wheezes. He has no rales.  Abdominal: He exhibits no mass. There is  no tenderness. There is no rebound and no guarding.  Musculoskeletal: He exhibits no edema.  Lymphadenopathy:    He has no cervical adenopathy.  Neurological: He is alert and oriented to person, place, and time. Coordination normal.  Skin: Skin is warm and dry. No rash noted.  Psychiatric: He has a normal mood and affect. His behavior is normal.      Assessment & Plan:   Darren was seen today for paperwork.  Diagnoses and all orders for this  visit:  Essential hypertension, benign -     Comprehensive metabolic panel -     Lipid panel   I am having Darren Pugh maintain his omeprazole, multivitamin, metoprolol tartrate, and metoprolol tartrate.  No orders of the defined types were placed in this encounter.    Appropriate red flag conditions were discussed with the patient as well as actions that should be taken.  Patient expressed his understanding.  Follow-up: Return if symptoms worsen or fail to improve.  Carmelina Dane, MD

## 2015-10-17 ENCOUNTER — Encounter: Payer: Self-pay | Admitting: *Deleted

## 2015-10-27 ENCOUNTER — Telehealth: Payer: Self-pay | Admitting: Cardiovascular Disease

## 2015-10-27 NOTE — Telephone Encounter (Signed)
3 attempts to schedule from recall .  LMOV to call office for scheduling.  ° °Deleting recall.   °

## 2016-10-27 ENCOUNTER — Telehealth: Payer: Self-pay | Admitting: Cardiovascular Disease

## 2016-10-27 NOTE — Telephone Encounter (Signed)
Please seen the top of my original message. I made an appointment with Dr Mariah MillingGollan on Monday 11/01/16 before sending this request.

## 2016-10-27 NOTE — Telephone Encounter (Signed)
Thank you :)

## 2016-10-27 NOTE — Telephone Encounter (Signed)
Pt wife calling asking if we can send in some refills on patient just until he comes in on Monday 11/01/16, he was last seen 2015   *STAT* If patient is at the pharmacy, call can be transferred to refill team.   1. Which medications need to be refilled? (please list name of each medication and dose if known)  Metoprolol 25 mg half tablet at bedtime  2. Which pharmacy/location (including street and city if local pharmacy) is medication to be sent to? [facility] on [address] [place]   3. Do they need a 30 day or 90 day supply? 30 day

## 2016-10-27 NOTE — Telephone Encounter (Signed)
Pt has not been seen in over 2 years. He needs an appt before we can send in refills.

## 2016-10-27 NOTE — Telephone Encounter (Signed)
Please advise if ok to refill Pt last seen 2015 ov.  moody

## 2016-11-01 ENCOUNTER — Ambulatory Visit (INDEPENDENT_AMBULATORY_CARE_PROVIDER_SITE_OTHER): Payer: BLUE CROSS/BLUE SHIELD | Admitting: Cardiovascular Disease

## 2016-11-01 ENCOUNTER — Encounter: Payer: Self-pay | Admitting: Cardiovascular Disease

## 2016-11-01 VITALS — BP 128/88 | HR 62 | Ht 71.0 in | Wt 187.0 lb

## 2016-11-01 DIAGNOSIS — I480 Paroxysmal atrial fibrillation: Secondary | ICD-10-CM

## 2016-11-01 DIAGNOSIS — R42 Dizziness and giddiness: Secondary | ICD-10-CM | POA: Diagnosis not present

## 2016-11-01 DIAGNOSIS — Z23 Encounter for immunization: Secondary | ICD-10-CM | POA: Diagnosis not present

## 2016-11-01 DIAGNOSIS — I2510 Atherosclerotic heart disease of native coronary artery without angina pectoris: Secondary | ICD-10-CM

## 2016-11-01 MED ORDER — METOPROLOL TARTRATE 25 MG PO TABS: 25.0000 mg | ORAL_TABLET | Freq: Two times a day (BID) | ORAL | 6 refills | Status: DC | PRN

## 2016-11-01 NOTE — Progress Notes (Signed)
Cardiology Office Note  Date:  11/01/2016   ID:  Darren Pugh, DOB 04/11/1951, MRN [id number]  PCP:  No PCP Per Patient   Chief Complaint  Patient presents with  . other    12 month follow up. Meds reviewed by the pt. verbally. "doing well."     HPI:  Darren Pugh is a very pleasant 65 year old gentleman with a remote history of atrial fibrillation and chest pain with evaluation in 2009, cardiac catheterization at that time showing 30% LAD disease, TEE with spontaneous conversion to normal sinus rhythm in the emergency room. Intermittent palpitations for 5-7 years. He presents for routine followup of his atrial fibrillation  In follow-up today he reports that he is doing well Rarely possibly 2 times per year he has spells where he is lightheaded, everything goes white Similar to 2009. Events from 2009 lasted a long time Rare episodes now lasting up to 3 minutes He is not particularly concerned, reports that coffee and other triggers such as if he overeats usually cause the spells He has lost 14 pounds from one year ago, cut back on his breads  He is been taking his metoprolol only as needed, does not want to take this on a regular basis Lab work below reviewed with him This was obtained from work through finger prick Total chol 135, HDL 34, tg 100, LDL 81, Glu 95  He denies any significant shortness of breath, edema, chest pain.  No palpitations concerning for arrhythmia. He denies any new symptoms and typically works a full day.  EKG today shows normal sinus rhythm with rate 62 beats per minute, no significant ST or T wave changes, PVCs    PMH:   has a past medical history of Allergy; History of hyperlipidemia; and PAF (paroxysmal atrial fibrillation) (HCC) (2009).  PSH:    Past Surgical History:  Procedure Laterality Date  . CARDIAC CATHETERIZATION     30%LAD;non-critical mid;normal dominant circumflex;normal nondominant right.  . TEE WITH CARDIOVERSION      Current  Outpatient Prescriptions  Medication Sig Dispense Refill  . metoprolol tartrate (LOPRESSOR) 25 MG tablet Take 1 tablet (25 mg total) by mouth 2 (two) times daily as needed. 60 tablet 6  . Multiple Vitamin (MULTIVITAMIN) tablet Take 1 tablet by mouth daily.    Marland Kitchen. omeprazole (PRILOSEC) 20 MG capsule Take 20 mg by mouth as needed.     No current facility-administered medications for this visit.      Allergies:   Patient has no known allergies.   Social History:  The patient  reports that he quit smoking about 19 years ago. His smoking use included Cigars. He has never used smokeless tobacco. He reports that he does not drink alcohol or use drugs.   Family History:   family history includes Arrhythmia in his mother.    Review of Systems: Review of Systems  Constitutional: Negative.   Respiratory: Negative.   Cardiovascular: Positive for palpitations.  Gastrointestinal: Negative.   Musculoskeletal: Negative.   Neurological: Negative.   Psychiatric/Behavioral: Negative.   All other systems reviewed and are negative.    PHYSICAL EXAM: VS:  BP 128/88 (BP Location: Left Arm, Patient Position: Sitting, Cuff Size: Normal)   Pulse 62   Ht 5\' 11"  (1.803 m)   Wt 187 lb (84.8 kg)   BMI 26.08 kg/m  , BMI Body mass index is 26.08 kg/m. GEN: Well nourished, well developed, in no acute distress  HEENT: normal  Neck: no JVD, carotid bruits, or  masses Cardiac: RRR; no murmurs, rubs, or gallops,no edema  Respiratory:  clear to auscultation bilaterally, normal work of breathing GI: soft, nontender, nondistended, + BS MS: no deformity or atrophy  Skin: warm and dry, no rash Neuro:  Strength and sensation are intact Psych: euthymic mood, full affect   Recent Labs: No results found for requested labs within last 8760 hours.    Lipid Panel Lab Results  Component Value Date   CHOL 123 (L) 10/16/2015   HDL 31 (L) 10/16/2015   LDLCALC 79 10/16/2015   TRIG 64 10/16/2015      Wt  Readings from Last 3 Encounters:  11/01/16 187 lb (84.8 kg)  10/16/15 201 lb 3.2 oz (91.3 kg)  09/02/14 194 lb 4 oz (88.1 kg)       ASSESSMENT AND PLAN:  Coronary artery disease involving native coronary artery of native heart without angina pectoris - Plan: EKG 12-Lead Currently with no symptoms of angina. No further workup at this time.  He does not want to take metoprolol on a regular basis  PAF (paroxysmal atrial fibrillation) (HCC) - Plan: EKG 12-Lead Recommended he closely track his episodes of arrhythmia Currently episodes are very rare He does not want to take any other medications at this time, only metoprolol as needed Low CHADS VASC, discussed with him in detail Wife also present in the room  Encounter for immunization - Plan: Flu Vaccine QUAD 36+ mos IM Flu shot provided  Dizziness Rare episodes of dizziness, vision goes white when he has rare episodes lasting several minutes at a time, likely from atrial fibrillation Long discussion as above Recommended he keep a diary of when he has episodes, call our office if these become more frequent Suggested he use a tracking device to monitor pulse and heart rate   Total encounter time more than 25 minutes  Greater than 50% was spent in counseling and coordination of care with the patient   Disposition:   F/U  12 months   Orders Placed This Encounter  Procedures  . Flu Vaccine QUAD 36+ mos IM  . EKG 12-Lead     Signed, Dossie Arbourim Ileene Allie, M.D., Ph.D. 11/01/2016  [facility] [place] [phone]

## 2016-11-01 NOTE — Patient Instructions (Signed)

## 2016-11-12 ENCOUNTER — Ambulatory Visit: Payer: Self-pay | Admitting: Cardiovascular Disease

## 2017-03-25 ENCOUNTER — Telehealth: Payer: Self-pay | Admitting: Cardiovascular Disease

## 2017-03-25 NOTE — Telephone Encounter (Signed)
We have not seen him since November. Dr. Mariah Milling has an opening this afternoon @ 2:40 - can you see if he wants to come in? Thank you!

## 2017-03-25 NOTE — Telephone Encounter (Signed)
Pt is in [place] today and cannot come in today. Pt is sched to see Dr. Mariah Milling 4/24 @ 1:40

## 2017-03-25 NOTE — Telephone Encounter (Signed)
Pt made appt for 03/29/2017 at 1:40 Previous note error.

## 2017-03-25 NOTE — Telephone Encounter (Signed)
Pt sister states pt had an episode on Monday where he started shaking and "babbling", this lasted for about an hour. He took his medication for fast heart rate, waited 30 minutes and took another one. She states he seems to be fine now. Pt also told her this happened about a month ago. Please call.

## 2017-03-25 NOTE — Telephone Encounter (Signed)
Pt sister made an appt for 1/24 at 1:40

## 2017-03-29 ENCOUNTER — Ambulatory Visit: Payer: BLUE CROSS/BLUE SHIELD | Admitting: Cardiovascular Disease

## 2017-04-14 NOTE — Progress Notes (Signed)
Cardiology Office Note  Date:  04/15/2017   ID:  Darren Pugh, DOB July 10, 1951, MRN [id number]  PCP:  Patient, No Pcp Per   Chief Complaint  Patient presents with  . other     Early f/u per pt call-pt wife stating pt had some racing heart spells/shaking and "babbling". PT c/o sob, cp at times. Reviewed meds with pt verbally.    HPI:  Darren Pugh is a very pleasant 66 year old gentleman with a remote history of  atrial fibrillation  chest pain with evaluation in 2009,  cardiac catheterization at that time showing 30% LAD disease,  TEE with spontaneous conversion to normal sinus rhythm in the emergency room.  Intermittent palpitations for 5-7 years.  He presents for routine followup of his atrial fibrillation  In follow-up today he reports that he stopped his metoprolol sometime ago Developed several episodes of lightheadedness where everything goes white for a brief period of time 3 recent episodes of lightheaded Since then has been on and off his metoprolol Symptoms seem to happen more after he has been driving long distances and then he stands up  Describes his symptoms as a "hot flash" One episode described as "babbling in his head", ""arm was shaking", lasted 15 sec,  Wife who presents with him today he is angry at him, reported that it was exacerbated by a head cold . She felt he was taking the wrong cold medication Since then he has restarted metoprolol BID,  Symptoms have improved, now mild   He reports that he has Been losing weight, eating less Otherwise denies any shortness of breath or chest pain  drives a truck  EKG personally reviewed by myself on todays visit Shows sinus bradycardia rate 47 bpm no significant ST or T-wave changes  Other past medical history reviewed On previous clinic visit he reported Rarely possibly 2 times per year he has spells where he is lightheaded, everything goes white Similar to 2009. Events from 2009 lasted a long time Rare episodes  now lasting up to 3 minutes He is not particularly concerned, reports that coffee and other triggers such as if he overeats usually cause the spells He has lost 14 pounds from one year ago, cut back on his breads  Previous lab work Total chol 135, HDL 34, tg 100, LDL 81, Glu 95   PMH:   has a past medical history of Allergy; History of hyperlipidemia; and PAF (paroxysmal atrial fibrillation) (HCC) (2009).  PSH:    Past Surgical History:  Procedure Laterality Date  . CARDIAC CATHETERIZATION     30%LAD;non-critical mid;normal dominant circumflex;normal nondominant right.  . TEE WITH CARDIOVERSION      Current Outpatient Prescriptions  Medication Sig Dispense Refill  . ASPIRIN 81 PO Take 81 mg by mouth daily.    . metoprolol tartrate (LOPRESSOR) 25 MG tablet Take 1 tablet (25 mg total) by mouth 2 (two) times daily as needed. 60 tablet 6   No current facility-administered medications for this visit.      Allergies:   Patient has no known allergies.   Social History:  The patient  reports that he quit smoking about 19 years ago. His smoking use included Cigars. He has never used smokeless tobacco. He reports that he drinks alcohol. He reports that he does not use drugs.   Family History:   family history includes Arrhythmia in his mother.    Review of Systems: Review of Systems  Constitutional: Negative.   Respiratory: Negative.   Cardiovascular:  Positive for palpitations.  Gastrointestinal: Negative.   Musculoskeletal: Negative.   Neurological: Negative.   Psychiatric/Behavioral: Negative.   All other systems reviewed and are negative.    PHYSICAL EXAM: VS:  BP 130/78 (BP Location: Left Arm, Patient Position: Sitting, Cuff Size: Normal)   Pulse (!) 47   Ht 6\' 2"  (1.88 m)   Wt 179 lb 8 oz (81.4 kg)   BMI 23.05 kg/m  , BMI Body mass index is 23.05 kg/m. GEN: Well nourished, well developed, in no acute distress  HEENT: normal  Neck: no JVD, carotid bruits, or  masses Cardiac: Regular rhythm, bradycardic; no murmurs, rubs, or gallops,no edema  Respiratory:  clear to auscultation bilaterally, normal work of breathing GI: soft, nontender, nondistended, + BS MS: no deformity or atrophy  Skin: warm and dry, no rash Neuro:  Strength and sensation are intact Psych: euthymic mood, full affect   Recent Labs: No results found for requested labs within last 8760 hours.    Lipid Panel Lab Results  Component Value Date   CHOL 123 (L) 10/16/2015   HDL 31 (L) 10/16/2015   LDLCALC 79 10/16/2015   TRIG 64 10/16/2015      Wt Readings from Last 3 Encounters:  04/15/17 179 lb 8 oz (81.4 kg)  11/01/16 187 lb (84.8 kg)  10/16/15 201 lb 3.2 oz (91.3 kg)       ASSESSMENT AND PLAN:  Coronary artery disease involving native coronary artery of native heart without angina pectoris - Plan: EKG 12-Lead Currently with no symptoms of angina. No further workup at this time. Continue current medication regimen.  PAF (paroxysmal atrial fibrillation) (HCC) - Plan: EKG 12-Lead Low CHADS VASC, discussed with him in detail He is having symptoms of near syncope Unable to exclude paroxysmal atrial fibrillation or other arrhythmia One month event monitor has been ordered Recommended he decrease metoprolol down to 25 mill grams in the morning, half pill at night  Dizziness Long history of rare dizzy spells We have ordered event monitor as above Unable to exclude orthostasis but blood pressure stable on today's visit    Total encounter time more than 25 minutes  Greater than 50% was spent in counseling and coordination of care with the patient   Disposition:   F/U  6 weeks to discuss event monitor    Orders Placed This Encounter  Procedures  . EKG 12-Lead     Signed, Dossie Arbourim Gollan, M.D., Ph.D. 04/15/2017  [facility] [place] [phone]

## 2017-04-15 ENCOUNTER — Encounter: Payer: Self-pay | Admitting: Cardiovascular Disease

## 2017-04-15 ENCOUNTER — Ambulatory Visit (INDEPENDENT_AMBULATORY_CARE_PROVIDER_SITE_OTHER): Payer: BLUE CROSS/BLUE SHIELD | Admitting: Cardiovascular Disease

## 2017-04-15 VITALS — BP 130/78 | HR 47 | Ht 74.0 in | Wt 179.5 lb

## 2017-04-15 DIAGNOSIS — I480 Paroxysmal atrial fibrillation: Secondary | ICD-10-CM | POA: Diagnosis not present

## 2017-04-15 DIAGNOSIS — I2510 Atherosclerotic heart disease of native coronary artery without angina pectoris: Secondary | ICD-10-CM

## 2017-04-15 DIAGNOSIS — R55 Syncope and collapse: Secondary | ICD-10-CM | POA: Diagnosis not present

## 2017-04-15 NOTE — Patient Instructions (Addendum)
Medication Instructions:   Take a whole metoprolol in the Am 1/2 pill in the PM  Labwork:  No new labs needed  Testing/Procedures:  We will order a event monitor, one month for near syncope, atrial fib (paroxysmal)  Your physician has recommended that you wear an event monitor. Event monitors are medical devices that record the heart's electrical activity. Doctors most often us these monitors to diagnose arrhythmias. Arrhythmias are problems with the speed or rhythm of the heartbeat. The monitor is a small, portable device. You can wear one while you do your normal daily activities. This is usually used to diagnose what is causing palpitations/syncope (passing out).  You will receive a call from [employer] to verify your address before they ship the monitor to your home. It is very important that you answer this call. When you receive the monitor, call the 1-800 # on the box and the [employer] representative will walk you through the application process.  This will also activate the monitor, so don't forget to call!  Follow-Up: It was a pleasure seeing you in the office today. Please call us if you have new issues that need to be addressed before your next appt.  [phone]  Your physician wants you to follow-up in: 6 weeks   If you need a refill on your cardiac medications before your next appointment, please call your pharmacy.     Cardiac Event Monitoring A cardiac event monitor is a small recording device that is used to detect abnormal heart rhythms (arrhythmias). The monitor is used to record your heart rhythm when you have symptoms, such as:  Fast heartbeats (palpitations), such as heart racing or fluttering.  Dizziness.  Fainting or light-headedness.  Unexplained weakness. Some monitors are wired to electrodes placed on your chest. Electrodes are flat, sticky disks that attach to your skin. Other monitors may be hand-held or worn on the wrist. The monitor can be  worn for up to 30 days. If the monitor is attached to your chest, a technician will prepare your chest for the electrode placement and show you how to work the monitor. Take time to practice using the monitor before you leave the office. Make sure you understand how to send the information from the monitor to your health care provider. In some cases, you may need to use a landline telephone instead of a cell phone. What are the risks? Generally, this device is safe to use, but it possible that the skin under the electrodes will become irritated. How to use your cardiac event monitor  Wear your monitor at all times, except when you are in water:  Do not let the monitor get wet.  Take the monitor off when you bathe. Do not swim or use a hot tub with it on.  Keep your skin clean. Do not put body lotion or moisturizer on your chest.  Change the electrodes as told by your health care provider or any time they stop sticking to your skin. You may need to use medical tape to keep them on.  Try to put the electrodes in slightly different places on your chest to help prevent skin irritation. They must remain in the area under your left breast and in the upper right section of your chest.  Make sure the monitor is safely clipped to your clothing or in a location close to your body that your health care provider recommends.  Press the button to record as soon as you feel heart-related symptoms, such as:  Dizziness.  Weakness.  Light-headedness.  Palpitations.  Thumping or pounding in your chest.  Shortness of breath.  Unexplained weakness.  Keep a diary of your activities, such as walking, doing chores, and taking medicine. It is very important to note what you were doing when you pushed the button to record your symptoms. This will help your health care provider determine what might be contributing to your symptoms.  Send the recorded information as recommended by your health care  provider. It may take some time for your health care provider to process the results.  Change the batteries as told by your health care provider.  Keep electronic devices away from your monitor. This includes:  Tablets.  MP3 players.  Cell phones.  While wearing your monitor you should avoid:  Electric blankets.  Armed forces operational officer.  Electric toothbrushes.  Microwave ovens.  Magnets.  Metal detectors. Get help right away if:  You have chest pain.  You have extreme difficulty breathing or shortness of breath.  You develop a very fast heartbeat that persists.  You develop dizziness that does not go away.  You faint or constantly feel like you are about to faint. Summary  A cardiac event monitor is a small recording device that is used to help detect abnormal heart rhythms (arrhythmias).  The monitor is used to record your heart rhythm when you have heart-related symptoms.  Make sure you understand how to send the information from the monitor to your health care provider.  It is important to press the button on the monitor when you have any heart-related symptoms.  Keep a diary of your activities, such as walking, doing chores, and taking medicine. It is very important to note what you were doing when you pushed the button to record your symptoms. This will help your health care provider learn what might be causing your symptoms. This information is not intended to replace advice given to you by your health care provider. Make sure you discuss any questions you have with your health care provider. Document Released: 08/31/2008 Document Revised: 11/06/2016 Document Reviewed: 11/06/2016 Elsevier Interactive Patient Education  2017 [employer].

## 2017-04-21 ENCOUNTER — Ambulatory Visit: Payer: BLUE CROSS/BLUE SHIELD | Admitting: Cardiovascular Disease

## 2017-04-27 ENCOUNTER — Telehealth: Payer: Self-pay | Admitting: Cardiovascular Disease

## 2017-04-27 NOTE — Telephone Encounter (Signed)
Updated info submitted to [employer].

## 2017-04-27 NOTE — Telephone Encounter (Signed)
Patient home phone malfunction please have [employer] verify address by calling [place] [phone]

## 2017-05-10 ENCOUNTER — Encounter (INDEPENDENT_AMBULATORY_CARE_PROVIDER_SITE_OTHER): Payer: BLUE CROSS/BLUE SHIELD

## 2017-05-10 DIAGNOSIS — I2510 Atherosclerotic heart disease of native coronary artery without angina pectoris: Secondary | ICD-10-CM | POA: Diagnosis not present

## 2017-05-10 DIAGNOSIS — I480 Paroxysmal atrial fibrillation: Secondary | ICD-10-CM | POA: Diagnosis not present

## 2017-05-24 ENCOUNTER — Ambulatory Visit: Payer: BLUE CROSS/BLUE SHIELD | Admitting: Cardiovascular Disease

## 2018-04-26 ENCOUNTER — Other Ambulatory Visit: Payer: Self-pay | Admitting: Cardiovascular Disease

## 2018-04-28 ENCOUNTER — Other Ambulatory Visit: Payer: Self-pay

## 2018-04-28 MED ORDER — METOPROLOL TARTRATE 25 MG PO TABS: 25.0000 mg | ORAL_TABLET | Freq: Two times a day (BID) | ORAL | 3 refills | Status: DC | PRN

## 2019-03-28 ENCOUNTER — Telehealth: Payer: Self-pay

## 2019-03-28 NOTE — Telephone Encounter (Signed)
Called patient from recall list.  No answer. No VM.

## 2019-04-11 NOTE — Telephone Encounter (Signed)
Called patient from recall.  No answer. No VM.  This is the second attempt per recall list  

## 2019-04-19 NOTE — Telephone Encounter (Signed)
Called patient from recall list.  No answer. No VM.  This is the 3rd attempt per recall list.  Will delete recall list.

## 2022-10-13 ENCOUNTER — Encounter: Payer: Self-pay | Admitting: Internal Medicine

## 2022-10-13 ENCOUNTER — Ambulatory Visit (INDEPENDENT_AMBULATORY_CARE_PROVIDER_SITE_OTHER): Payer: BC Managed Care – PPO

## 2022-10-13 ENCOUNTER — Ambulatory Visit: Payer: BC Managed Care – PPO | Attending: Internal Medicine | Admitting: Internal Medicine

## 2022-10-13 VITALS — BP 138/80 | HR 48 | Ht 74.0 in | Wt 185.2 lb

## 2022-10-13 DIAGNOSIS — I2510 Atherosclerotic heart disease of native coronary artery without angina pectoris: Secondary | ICD-10-CM

## 2022-10-13 DIAGNOSIS — E785 Hyperlipidemia, unspecified: Secondary | ICD-10-CM

## 2022-10-13 DIAGNOSIS — R319 Hematuria, unspecified: Secondary | ICD-10-CM

## 2022-10-13 DIAGNOSIS — I480 Paroxysmal atrial fibrillation: Secondary | ICD-10-CM | POA: Diagnosis not present

## 2022-10-13 MED ORDER — ATORVASTATIN CALCIUM 20 MG PO TABS: 20.0000 mg | ORAL_TABLET | Freq: Every day | ORAL | 3 refills | Status: DC

## 2022-10-13 MED ORDER — APIXABAN 5 MG PO TABS: 5.0000 mg | ORAL_TABLET | Freq: Two times a day (BID) | ORAL | 11 refills | Status: DC

## 2022-10-13 NOTE — Patient Instructions (Signed)
Medication Instructions:  Your physician has recommended you make the following change in your medication:   1) START apixaban (Eliquis) 5mg  twice daily 2) START atorvastatin (Lipitor) 20mg  daily  *If you need a refill on your cardiac medications before your next appointment, please call your pharmacy*  Lab Work: In 2 months: fasting Lipid panel, LFTs, LP(a) If you have labs (blood work) drawn today and your tests are completely normal, you will receive your results only by: MyChart Message (if you have MyChart) OR A paper copy in the mail If you have any lab test that is abnormal or we need to change your treatment, we will call you to review the results.  Testing/Procedures: Your physician has requested that you wear a Zio heart monitor for 7 days. This will be mailed to your home with instructions on how to apply the monitor and how to return it when finished. Please allow 2 weeks after returning the heart monitor before our office calls you with the results.   Your physician has requested that you have an echocardiogram. Echocardiography is a painless test that uses sound waves to create images of your heart. It provides your doctor with information about the size and shape of your heart and how well your heart's chambers and valves are working. This procedure takes approximately one hour. There are no restrictions for this procedure. Please do NOT wear cologne, perfume, aftershave, or lotions (deodorant is allowed). Please arrive 15 minutes prior to your appointment time.   Follow-Up: At [facility], you and your health needs are our priority.  As part of our continuing mission to provide you with exceptional heart care, we have created designated Provider Care Teams.  These Care Teams include your primary Cardiologist (physician) and Advanced Practice Providers (APPs -  Physician Assistants and Nurse Practitioners) who all work together to provide you with the care you need,  when you need it.  Your next appointment:   6 month(s)  The format for your next appointment:   In Person  Provider:   , MD  Other Instructions [facility]- Long Term Monitor Instructions     Your physician has requested you wear a ZIO patch monitor for 7 days.  This is a single patch monitor. [employer] supplies one patch monitor per enrollment. Additional  stickers are not available. Please do not apply patch if you will be having a Nuclear Stress Test,  Echocardiogram, Cardiac CT, MRI, or Chest Xray during the period you would be wearing the  monitor. The patch cannot be worn during these tests. You cannot remove and re-apply the  ZIO XT patch monitor.  Your ZIO patch monitor will be mailed 3 day USPS to your address on file. It may take 3-5 days  to receive your monitor after you have been enrolled.  Once you have received your monitor, please review the enclosed instructions. Your monitor  has already been registered assigning a specific monitor serial # to you.     Billing and Patient Assistance Program Information     We have supplied [employer] with any of your insurance information on file for billing purposes.  [employer] offers a sliding scale Patient Assistance Program for patients that do not have  insurance, or whose insurance does not completely cover the cost of the ZIO monitor.  You must apply for the Patient Assistance Program to qualify for this discounted rate.  To apply, please call [employer] at [phone], select option 4, select option 2,  ask to apply for  Patient Assistance Program. Meredeth Ide will ask your household income, and how many people  are in your household. They will quote your out-of-pocket cost based on that information.  [employer] will also be able to set up a 29-month, interest-free payment plan if needed.     Applying the monitor     Shave hair from upper left chest.  Hold abrader disc by orange tab. Rub abrader in 40 strokes over the upper  left chest as  indicated in your monitor instructions.  Clean area with 4 enclosed alcohol pads. Let dry.  Apply patch as indicated in monitor instructions. Patch will be placed under collarbone on left  side of chest with arrow pointing upward.  Rub patch adhesive wings for 2 minutes. Remove white label marked "1". Remove the white  label marked "2". Rub patch adhesive wings for 2 additional minutes.  While looking in a mirror, press and release button in center of patch. A small green light will  flash 3-4 times. This will be your only indicator that the monitor has been turned on.  Do not shower for the first 24 hours. You may shower after the first 24 hours.  Press the button if you feel a symptom. You will hear a small click. Record Date, Time and  Symptom in the Patient Logbook.  When you are ready to remove the patch, follow instructions on the last 2 pages of Patient  Logbook. Stick patch monitor onto the last page of Patient Logbook.  Place Patient Logbook in the blue and white box. Use locking tab on box and tape box closed  securely. The blue and white box has prepaid postage on it. Please place it in the mailbox as  soon as possible. Your physician should have your test results approximately 7 days after the  monitor has been mailed back to [facility].  Call [facility] Customer Care at [phone] if you have questions regarding  your ZIO XT patch monitor. Call them immediately if you see an orange light blinking on your  monitor.  If your monitor falls off in less than 4 days, contact our Monitor department at [phone].  If your monitor becomes loose or falls off after 4 days call [employer] at [phone] for  suggestions on securing your monitor.    Important Information About Sugar

## 2022-10-13 NOTE — Progress Notes (Signed)
Cardiology Office Note:    Date:  10/13/2022   ID:  Darren Pugh, DOB [birth year], MRN [id number]  PCP:  Patient, No Pcp Per   [facility] Cardiologist:  Lenna Sciara, MD Referring MD: No ref. provider found   Chief Complaint/Reason for Referral: Low blood pressure  ASSESSMENT:    1. Coronary artery disease involving native coronary artery of native heart without angina pectoris   2. Hyperlipidemia LDL goal <70   3. PAF (paroxysmal atrial fibrillation) ([place])   4. Hematuria, unspecified type     PLAN:    In order of problems listed above: 1.  Coronary artery disease: We will start Eliquis in lieu of aspirin as detailed below as well as atorvastatin 20 mg. 2.  Hyperlipidemia: Given presence of mild obstructive coronary artery disease patient should be on statin.  We will start atorvastatin 20 mg daily and check lipid panel, LFTs, LP(a) in 2 months. 3.  Paroxysmal atrial fibrillation: Patient's CV 2 score is 1.  He does not have hypertension and his metoprolol is for atrial fibrillation.  We will start Eliquis 5 mg twice daily.  We will obtain a monitor and echocardiogram.  Depending on results we will consider EP evaluation given his bradycardia. 4.  Hematuria:  Resolved, monitor for now.  If recurs, needs to see Urology.  Recommended obtaining PCP.             Dispo:  Return in about 6 months (around 04/13/2023).      Medication Adjustments/Labs and Tests Ordered: Current medicines are reviewed at length with the patient today.  Concerns regarding medicines are outlined above.  The following changes have been made:     Labs/tests ordered: Orders Placed This Encounter  Procedures   Lipid panel   Hepatic function panel   Lipoprotein A (LPA)   LONG TERM MONITOR (3-14 DAYS)   EKG 12-Lead   ECHOCARDIOGRAM COMPLETE    Medication Changes: Meds ordered this encounter  Medications   apixaban (ELIQUIS) 5 MG TABS tablet    Sig: Take 1 tablet (5 mg total)  by mouth 2 (two) times daily.    Dispense:  60 tablet    Refill:  11   atorvastatin (LIPITOR) 20 MG tablet    Sig: Take 1 tablet (20 mg total) by mouth daily.    Dispense:  90 tablet    Refill:  3     Current medicines are reviewed at length with the patient today.  The patient does not have concerns regarding medicines.   History of Present Illness:    FOCUSED PROBLEM LIST:   1.  Mild obstructive coronary artery disease on cardiac catheterization 2019 with 30% LAD lesion 2.  Hyperlipidemia 3.  Paroxysmal atrial fibrillation status post cardioversion; CV 2 score of 1  The patient is a 71 y.o. male with the indicated medical history here for establish cardiovascular care.  The patient sometimes works out of state.  He has been off a lot of his medications for some time.  He is here with his wife.  He denies any exertional angina or exertional dyspnea.  He has had palpitations maybe 3 times within the last month or so.  They are not associated with presyncope, chest pain, or shortness of breath.  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  He has not required hospitalizations or emergency room visits for any reason.  He does not smoke.  He did notice some transient hematuria but he believes  he was passing a kidney stone.  It has not happened within the last several weeks.         Current Medications: Current Meds  Medication Sig   apixaban (ELIQUIS) 5 MG TABS tablet Take 1 tablet (5 mg total) by mouth 2 (two) times daily.   atorvastatin (LIPITOR) 20 MG tablet Take 1 tablet (20 mg total) by mouth daily.   Cholecalciferol (VITAMIN D-3 PO) Take 10 mcg by mouth once a week.   Cranberry 4200 MG CAPS Take 1 capsule by mouth daily at 6 (six) AM.   MAGNESIUM GLYCINATE PO Take 400 mg by mouth 3 (three) times a week.   metoprolol tartrate (LOPRESSOR) 25 MG tablet Take 1 tablet (25 mg total) by mouth 2 (two) times daily as needed.   OVER THE COUNTER MEDICATION Take 1 tablet by  mouth 3 (three) times a week. POTASSIUM GLUCONATE SUPPLEMENT     Allergies:    Patient has no known allergies.   Social History:   Social History   Tobacco Use   Smoking status: Former    Types: Cigars    Quit date: 10/02/1997    Years since quitting: 25.0   Smokeless tobacco: Never  Vaping Use   Vaping Use: Never used  Substance Use Topics   Alcohol use: Yes    Comment: 6 beers/day   Drug use: No     Family Hx: Family History  Problem Relation Age of Onset   Arrhythmia Mother      Review of Systems:   Please see the history of present illness.    All other systems reviewed and are negative.     EKGs/Labs/Other Test Reviewed:    EKG:  EKG performed 2018 that I personally reviewed demonstrates sinus bradycardia; EKG performed today that I personally reviewed demonstrates sinus bradycardia.  Prior CV studies:  2018 monitor: Normal sinus rhythm No significant arrhythmia noted Monitor only worn 3 days  Other studies Reviewed: Review of the additional studies/records demonstrates: No imaging studies available demonstrating coronary artery calcification or aortic atherosclerosis  Recent Labs: No results found for requested labs within last 365 days.   Recent Lipid Panel Lab Results  Component Value Date/Time   CHOL 123 (L) 10/16/2015 09:45 AM   TRIG 64 10/16/2015 09:45 AM   HDL 31 (L) 10/16/2015 09:45 AM   LDLCALC 79 10/16/2015 09:45 AM    Risk Assessment/Calculations:     CHA2DS2-VASc Score = 1   This indicates a 0.6% annual risk of stroke. The patient's score is based upon: CHF History: 0 HTN History: 0 Diabetes History: 0 Stroke History: 0 Vascular Disease History: 0 Age Score: 1 Gender Score: 0               Physical Exam:    VS:  BP 138/80   Pulse (!) 48   Ht 6\' 2"  (1.88 m)   Wt 185 lb 3.2 oz (84 kg)   SpO2 96%   BMI 23.78 kg/m    Wt Readings from Last 3 Encounters:  10/13/22 185 lb 3.2 oz (84 kg)  04/15/17 179 lb 8 oz (81.4  kg)  11/01/16 187 lb (84.8 kg)    GENERAL:  No apparent distress, AOx3 HEENT:  No carotid bruits, +2 carotid impulses, no scleral icterus CAR: RRR  no murmurs, gallops, rubs, or thrills RES:  Clear to auscultation bilaterally ABD:  Soft, nontender, nondistended, positive bowel sounds x 4 VASC:  +2 radial pulses, +2 carotid pulses, palpable pedal pulses NEURO:  CN 2-12 grossly intact; motor and sensory grossly intact PSYCH:  No active depression or anxiety EXT:  No edema, ecchymosis, or cyanosis  Signed, Early Osmond, MD  10/13/2022 4:30 PM    [address], [place], [place]  [address] Phone: [phone]; Fax: [phone]   Note:  This document was prepared using Dragon voice recognition software and may include unintentional dictation errors.

## 2022-10-13 NOTE — Progress Notes (Unsigned)
Enrolled for [employer] to mail a ZIO XT long term holter monitor to the patients address on file.  

## 2022-10-15 DIAGNOSIS — I2510 Atherosclerotic heart disease of native coronary artery without angina pectoris: Secondary | ICD-10-CM | POA: Diagnosis not present

## 2022-10-15 DIAGNOSIS — Z125 Encounter for screening for malignant neoplasm of prostate: Secondary | ICD-10-CM | POA: Diagnosis not present

## 2022-10-15 DIAGNOSIS — I480 Paroxysmal atrial fibrillation: Secondary | ICD-10-CM | POA: Diagnosis not present

## 2022-10-15 DIAGNOSIS — R319 Hematuria, unspecified: Secondary | ICD-10-CM | POA: Diagnosis not present

## 2022-10-20 DIAGNOSIS — I480 Paroxysmal atrial fibrillation: Secondary | ICD-10-CM | POA: Diagnosis not present

## 2022-11-02 ENCOUNTER — Ambulatory Visit (HOSPITAL_COMMUNITY): Payer: BC Managed Care – PPO | Attending: Internal Medicine

## 2022-11-02 DIAGNOSIS — I480 Paroxysmal atrial fibrillation: Secondary | ICD-10-CM | POA: Diagnosis not present

## 2022-11-03 LAB — ECHOCARDIOGRAM COMPLETE
Area-P 1/2: 3.27 cm2
S' Lateral: 2.5 cm

## 2022-11-11 DIAGNOSIS — I480 Paroxysmal atrial fibrillation: Secondary | ICD-10-CM | POA: Diagnosis not present

## 2022-12-20 ENCOUNTER — Ambulatory Visit: Payer: BC Managed Care – PPO | Attending: Internal Medicine

## 2022-12-20 DIAGNOSIS — E785 Hyperlipidemia, unspecified: Secondary | ICD-10-CM | POA: Diagnosis not present

## 2022-12-21 LAB — HEPATIC FUNCTION PANEL
ALT: 21 IU/L (ref 0–44)
AST: 16 IU/L (ref 0–40)
Albumin: 4.2 g/dL (ref 3.9–4.9)
Alkaline Phosphatase: 53 IU/L (ref 44–121)
Bilirubin Total: 1.5 mg/dL — ABNORMAL HIGH (ref 0.0–1.2)
Bilirubin, Direct: 0.35 mg/dL (ref 0.00–0.40)
Total Protein: 6.2 g/dL (ref 6.0–8.5)

## 2022-12-21 LAB — LIPID PANEL
Chol/HDL Ratio: 2.5 ratio (ref 0.0–5.0)
Cholesterol, Total: 82 mg/dL — ABNORMAL LOW (ref 100–199)
HDL: 33 mg/dL — ABNORMAL LOW (ref >39)
LDL Chol Calc (NIH): 35 mg/dL (ref 0–99)
Triglycerides: 63 mg/dL (ref 0–149)
VLDL Cholesterol Cal: 14 mg/dL (ref 5–40)

## 2022-12-21 LAB — LIPOPROTEIN A (LPA): Lipoprotein (a): 43.3 nmol/L (ref <75.0)

## 2023-01-06 ENCOUNTER — Telehealth: Payer: Self-pay | Admitting: Cardiovascular Disease

## 2023-01-06 NOTE — Telephone Encounter (Signed)
No answer. No voicemail. 

## 2023-01-06 NOTE — Telephone Encounter (Signed)
Pt c/o medication issue:  1. Name of Medication: apixaban (ELIQUIS) 5 MG TABS tablet   atorvastatin (LIPITOR) 20 MG tablet   2. How are you currently taking this medication (dosage and times per day)?    3. Are you having a reaction (difficulty breathing--STAT)? no  4. What is your medication issue? Patient calling because he believe the medication is dropping his bp (hasn't had the chance to check his bp). Please advise

## 2023-01-07 NOTE — Telephone Encounter (Signed)
Patient walked into office today to ask about medications/symptoms.  He has a list of several blood pressures with him.  They are all 90s/50s.  He is feeling lightheaded and weak at times.  Notices when changes from sitting to standing or when he is standing a while during work.  He brought Lipitor and Eliquis w  him and states he has not taken Eliquis since yesterday morning.  We discussed that his symptoms may be related to his blood pressure being on the low side and that the meds are not the cause of low bps.  I asked him to continue them.  He has lopressor on his list but states he has not taken this in over a year.    He said he does good to drink 2 cups of water a day.  We talked about hydration and he is going to increase his water intake and at times when he feels the lightheadedness will eat a small salty snack. Minimize caffeine.    Also states he never checked the portal for the monitor results so we reviewed these.  He is scheduled for follow up in May.   He is appreciative for assistance today.

## 2023-04-08 NOTE — Progress Notes (Unsigned)
Cardiology Office Note:    Date:  04/11/2023   ID:  Darren Pugh, DOB [birth year], MRN [id number]  PCP:  Patient, No Pcp Per   [facility] Providers Cardiologist:  Alverda Skeans, MD Referring MD: Orbie Pyo, MD   Chief Complaint/Reason for Referral: Low blood pressure  PATIENT DID NOT APPEAR FOR APPOINTMENT   ASSESSMENT:    1. Coronary artery disease involving native coronary artery of native heart without angina pectoris   2. Hyperlipidemia LDL goal <70   3. PAF (paroxysmal atrial fibrillation) ([facility])      PLAN:    In order of problems listed above: 1.  Coronary artery disease: Continue Eliquis and lieu of aspirin as well as atorvastatin.   2.  Hyperlipidemia: LDL was 35 recently; continue atorvastatin. 3.  Paroxysmal atrial fibrillation: Monitor over 8 days did not show not show any atrial fibrillation.  Would continue anticoagulation for now.              Dispo:  No follow-ups on file.      Medication Adjustments/Labs and Tests Ordered: Current medicines are reviewed at length with the patient today.  Concerns regarding medicines are outlined above.  The following changes have been made:     Labs/tests ordered: No orders of the defined types were placed in this encounter.   Medication Changes: No orders of the defined types were placed in this encounter.    Current medicines are reviewed at length with the patient today.  The patient does not have concerns regarding medicines.   History of Present Illness:    FOCUSED PROBLEM LIST:   1.  Mild obstructive coronary artery disease on cardiac catheterization 2019 with 30% LAD lesion 2.  Hyperlipidemia; low LP(a) 3.  Paroxysmal atrial fibrillation status post cardioversion; CV 2 score of 06 October 2022: The patient is a 72 y.o. male with the indicated medical history here for establish cardiovascular care.  The patient sometimes works out of state.  He has been off a lot of his medications for  some time.  He is here with his wife.  He denies any exertional angina or exertional dyspnea.  He has had palpitations maybe 3 times within the last month or so.  They are not associated with presyncope, chest pain, or shortness of breath.  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  He has not required hospitalizations or emergency room visits for any reason.  He does not smoke.  He did notice some transient hematuria but he believes he was passing a kidney stone.  It has not happened within the last several weeks.  Plan: Start Eliquis, start atorvastatin 20 mg and check lipid panel, LFTs, LP(a) in 2 months.  Obtain monitor and echocardiogram.  Today: In the interim the patient had an 8-day monitor which demonstrated no atrial fibrillation.  His echocardiogram demonstrated preserved LV function with no significant valvular abnormalities.  LDL was less than 55.         Current Medications: No outpatient medications have been marked as taking for the 04/11/23 encounter (Office Visit) with Orbie Pyo, MD.     Allergies:    Patient has no known allergies.   Social History:   Social History   Tobacco Use   Smoking status: Former    Types: Cigars    Quit date: 10/02/1997    Years since quitting: 25.5   Smokeless tobacco: Never  Vaping Use   Vaping Use: Never used  Substance Use Topics   Alcohol use: Yes    Comment: 6 beers/day   Drug use: No     Family Hx: Family History  Problem Relation Age of Onset   Arrhythmia Mother      Review of Systems:   Please see the history of present illness.    All other systems reviewed and are negative.     EKGs/Labs/Other Test Reviewed:    EKG:  EKG performed 2018 that I personally reviewed demonstrates sinus bradycardia; EKG performed today that I personally reviewed demonstrates sinus bradycardia.  Prior CV studies:  TTE 2023:  1. Left ventricular ejection fraction, by estimation, is 60 to 65%. The  left ventricle  has normal function. The left ventricle has no regional  wall motion abnormalities. Left ventricular diastolic parameters are  consistent with Grade I diastolic  dysfunction (impaired relaxation). Elevated left ventricular end-diastolic  pressure. The average left ventricular global longitudinal strain is -20.6  %. The global longitudinal strain is normal.   2. Right ventricular systolic function is normal. The right ventricular  size is normal. There is mildly elevated pulmonary artery systolic  pressure.   3. The mitral valve is normal in structure. Trivial mitral valve  regurgitation. No evidence of mitral stenosis.   4. The aortic valve is tricuspid. Aortic valve regurgitation is trivial.  No aortic stenosis is present.   5. Aortic dilatation noted. There is mild dilatation of the aortic root,  measuring 42 mm. There is mild dilatation of the ascending aorta,  measuring 39 mm.   6. The inferior vena cava is dilated in size with <50% respiratory  variability, suggesting right atrial pressure of 15 mmHg.   Monitor 2023: 6 Supraventricular Tachycardia runs occurred, the run with the fastest interval lasting 10 beats with a max rate of 193 bpm, the longest lasting 14 beats with an avg rate of 126 bpm.    Isolated SVEs were rare (<1.0%), SVE Couplets were rare (<1.0%), and SVE Triplets were rare (<1.0%).    Isolated VEs were rare (<1.0%), VE Couplets were rare (<1.0%), and no VE Triplets were present.    No atrial fibrillation, sustained ventricular tachyarrhythmias, or bradyarrhythmias were detected.   Patient triggered events corresponded with sinus rhythm, PACs, and PVCs.  2018 monitor: Normal sinus rhythm No significant arrhythmia noted Monitor only worn 3 days  Other studies Reviewed: Review of the additional studies/records demonstrates: No imaging studies available demonstrating coronary artery calcification or aortic atherosclerosis  Recent Labs: 12/20/2022: ALT 21    Recent Lipid Panel Lab Results  Component Value Date/Time   CHOL 82 (L) 12/20/2022 07:53 AM   TRIG 63 12/20/2022 07:53 AM   HDL 33 (L) 12/20/2022 07:53 AM   LDLCALC 35 12/20/2022 07:53 AM    Risk Assessment/Calculations:     CHA2DS2-VASc Score = 1   This indicates a 0.6% annual risk of stroke. The patient's score is based upon: CHF History: 0 HTN History: 0 Diabetes History: 0 Stroke History: 0 Vascular Disease History: 0 Age Score: 1 Gender Score: 0           Physical Exam:      Signed, Orbie Pyo, MD  04/11/2023 4:11 PM    [facility] [address] [place], [place], [place]  [address] Phone: [phone]; Fax: [phone]   Note:  This document was prepared using Dragon voice recognition software and may include unintentional dictation errors.

## 2023-04-11 ENCOUNTER — Ambulatory Visit: Payer: BC Managed Care – PPO | Attending: Internal Medicine | Admitting: Internal Medicine

## 2023-04-11 DIAGNOSIS — E785 Hyperlipidemia, unspecified: Secondary | ICD-10-CM

## 2023-04-11 DIAGNOSIS — I2510 Atherosclerotic heart disease of native coronary artery without angina pectoris: Secondary | ICD-10-CM

## 2023-04-11 DIAGNOSIS — I480 Paroxysmal atrial fibrillation: Secondary | ICD-10-CM

## 2023-08-28 ENCOUNTER — Encounter (HOSPITAL_COMMUNITY): Payer: Self-pay | Admitting: *Deleted

## 2023-08-28 ENCOUNTER — Other Ambulatory Visit: Payer: Self-pay

## 2023-08-28 ENCOUNTER — Emergency Department (HOSPITAL_COMMUNITY): Payer: BC Managed Care – PPO

## 2023-08-28 ENCOUNTER — Emergency Department (HOSPITAL_COMMUNITY)
Admission: EM | Admit: 2023-08-28 | Discharge: 2023-08-28 | Disposition: A | Discharge status: Home / Self Care | Payer: BC Managed Care – PPO | Attending: Emergency Medicine | Admitting: Emergency Medicine

## 2023-08-28 DIAGNOSIS — I4891 Unspecified atrial fibrillation: Secondary | ICD-10-CM | POA: Insufficient documentation

## 2023-08-28 DIAGNOSIS — Z7901 Long term (current) use of anticoagulants: Secondary | ICD-10-CM | POA: Insufficient documentation

## 2023-08-28 DIAGNOSIS — R0789 Other chest pain: Secondary | ICD-10-CM | POA: Diagnosis not present

## 2023-08-28 DIAGNOSIS — R079 Chest pain, unspecified: Secondary | ICD-10-CM | POA: Diagnosis not present

## 2023-08-28 LAB — BASIC METABOLIC PANEL
Anion gap: 8 (ref 5–15)
BUN: 11 mg/dL (ref 8–23)
CO2: 25 mmol/L (ref 22–32)
Calcium: 8.5 mg/dL — ABNORMAL LOW (ref 8.9–10.3)
Chloride: 106 mmol/L (ref 98–111)
Creatinine, Ser: 1.12 mg/dL (ref 0.61–1.24)
GFR, Estimated: >60 mL/min (ref >60)
Glucose, Bld: 118 mg/dL — ABNORMAL HIGH (ref 70–99)
Potassium: 3.7 mmol/L (ref 3.5–5.1)
Sodium: 139 mmol/L (ref 135–145)

## 2023-08-28 LAB — CBC
HCT: 44.7 % (ref 39.0–52.0)
Hemoglobin: 15.2 g/dL (ref 13.0–17.0)
MCH: 32.8 pg (ref 26.0–34.0)
MCHC: 34 g/dL (ref 30.0–36.0)
MCV: 96.5 fL (ref 80.0–100.0)
Platelets: 192 10*3/uL (ref 150–400)
RBC: 4.63 MIL/uL (ref 4.22–5.81)
RDW: 12.4 % (ref 11.5–15.5)
WBC: 10.3 10*3/uL (ref 4.0–10.5)
nRBC: 0 % (ref 0.0–0.2)

## 2023-08-28 LAB — MAGNESIUM: Magnesium: 1.9 mg/dL (ref 1.7–2.4)

## 2023-08-28 LAB — TROPONIN I (HIGH SENSITIVITY)
Troponin I (High Sensitivity): 11 ng/L (ref <18)
Troponin I (High Sensitivity): 55 ng/L — ABNORMAL HIGH (ref <18)

## 2023-08-28 MED ORDER — METOPROLOL TARTRATE 25 MG PO TABS: 25.0000 mg | ORAL_TABLET | Freq: Two times a day (BID) | ORAL | 3 refills | Status: DC | PRN

## 2023-08-28 MED ORDER — SODIUM CHLORIDE 0.9 % IV BOLUS
500.0000 mL | Freq: Once | INTRAVENOUS | Status: AC
  Administered 2023-08-28: 500 mL via INTRAVENOUS

## 2023-08-28 MED ORDER — METOPROLOL TARTRATE 5 MG/5ML IV SOLN
5.0000 mg | INTRAVENOUS | Status: DC | PRN
  Administered 2023-08-28: 5 mg via INTRAVENOUS
  Filled 2023-08-28: qty 5

## 2023-08-28 NOTE — ED Triage Notes (Signed)
The pt is c/o tachycardia and some chest pressure for 3  days and much worse today  heart rate 160 per EKG  a and o x 3

## 2023-08-28 NOTE — ED Provider Notes (Signed)
[facility] EMERGENCY DEPARTMENT AT [facility] Provider Note   CSN: [id number] Arrival date & time: 08/28/23  1527     History  Chief Complaint  Patient presents with   Chest Pain    Darren Pugh is a 72 y.o. male.   Chest Pain    Patient has history of hyperlipidemia paroxysmal atrial fibrillation.  Patient presented to the ED for evaluation of feeling lightheaded.  Patient states he was working outside today.  Few hours ago he started to feel very lightheaded.  Patient felt his heart racing and he had some discomfort in his chest.  Patient felt like he was back in atrial fibrillation.  Patient denies any fevers or chills.  No cough.  No vomiting or diarrhea.  Patient states he used to be on metoprolol but has not been on that recently.  He has been compliant with his Eliquis.  Home Medications Prior to Admission medications   Medication Sig Start Date End Date Taking? Authorizing Provider  apixaban (ELIQUIS) 5 MG TABS tablet Take 1 tablet (5 mg total) by mouth 2 (two) times daily. 10/13/22   Orbie Pyo, MD  atorvastatin (LIPITOR) 20 MG tablet Take 1 tablet (20 mg total) by mouth daily. 10/13/22   Orbie Pyo, MD  Cholecalciferol (VITAMIN D-3 PO) Take 10 mcg by mouth once a week.    [provider]  Cranberry 4200 MG CAPS Take 1 capsule by mouth daily at 6 (six) AM.    [provider]  MAGNESIUM GLYCINATE PO Take 400 mg by mouth 3 (three) times a week.    [provider]  metoprolol tartrate (LOPRESSOR) 25 MG tablet Take 1 tablet (25 mg total) by mouth 2 (two) times daily as needed. 08/28/23   Linwood Dibbles, MD  OVER THE COUNTER MEDICATION Take 1 tablet by mouth 3 (three) times a week. POTASSIUM GLUCONATE SUPPLEMENT    [provider]      Allergies    Patient has no known allergies.    Review of Systems   Review of Systems  Cardiovascular:  Positive for chest pain.    Physical Exam Updated Vital Signs BP 130/80    Pulse (!) 55   Temp 98 F (36.7 C) (Oral)   Resp 17   Ht 1.88 m (6\' 2" )   Wt 83.9 kg   SpO2 100%   BMI 23.75 kg/m  Physical Exam Vitals and nursing note reviewed.  Constitutional:      General: He is not in acute distress.    Appearance: He is well-developed.  HENT:     Head: Normocephalic and atraumatic.     Right Ear: External ear normal.     Left Ear: External ear normal.  Eyes:     General: No scleral icterus.       Right eye: No discharge.        Left eye: No discharge.     Conjunctiva/sclera: Conjunctivae normal.  Neck:     Trachea: No tracheal deviation.  Cardiovascular:     Rate and Rhythm: Tachycardia present. Rhythm irregularly irregular.  Pulmonary:     Effort: Pulmonary effort is normal. No respiratory distress.     Breath sounds: Normal breath sounds. No stridor. No wheezing or rales.  Abdominal:     General: Bowel sounds are normal. There is no distension.     Palpations: Abdomen is soft.     Tenderness: There is no abdominal tenderness. There is no guarding or rebound.  Musculoskeletal:        General: No tenderness or deformity.     Cervical back: Neck supple.  Skin:    General: Skin is warm and dry.     Findings: No rash.  Neurological:     General: No focal deficit present.     Mental Status: He is alert.     Cranial Nerves: No cranial nerve deficit, dysarthria or facial asymmetry.     Sensory: No sensory deficit.     Motor: No abnormal muscle tone or seizure activity.     Coordination: Coordination normal.  Psychiatric:        Mood and Affect: Mood normal.     ED Results / Procedures / Treatments   Labs (all labs ordered are listed, but only abnormal results are displayed) Labs Reviewed  BASIC METABOLIC PANEL - Abnormal; Notable for the following components:      Result Value   Glucose, Bld 118 (*)    Calcium 8.5 (*)    All other components within normal limits  TROPONIN I (HIGH SENSITIVITY) - Abnormal; Notable for the following  components:   Troponin I (High Sensitivity) 55 (*)    All other components within normal limits  CBC  MAGNESIUM  TROPONIN I (HIGH SENSITIVITY)    EKG EKG Interpretation Date/Time:  Sunday August 28 2023 17:38:46 EDT Ventricular Rate:  62 PR Interval:  191 QRS Duration:  100 QT Interval:  426 QTC Calculation: 433 R Axis:   58  Text Interpretation: Sinus rhythm a fib resolved since last tracing Confirmed by Linwood Dibbles [id number]) on 08/28/2023 5:51:54 PM  Radiology DG Chest Portable 1 View  Result Date: 08/28/2023 CLINICAL DATA:  Chest pain. EXAM: PORTABLE CHEST 1 VIEW COMPARISON:  February 21, 2008 FINDINGS: The cardiomediastinal silhouette is mildly enlarged in contour, likely accentuated by low lung volumes and technique.Atherosclerotic calcifications. No pleural effusion. No pneumothorax. No acute pleuroparenchymal abnormality. IMPRESSION: No acute cardiopulmonary abnormality. Electronically Signed   By: Meda Klinefelter M.D.   On: 08/28/2023 16:12    Procedures .Critical Care  Performed by: Linwood Dibbles, MD Authorized by: Linwood Dibbles, MD   Critical care provider statement:    Critical care time (minutes):  30   Critical care was time spent personally by me on the following activities:  Development of treatment plan with patient or surrogate, discussions with consultants, evaluation of patient's response to treatment, examination of patient, ordering and review of laboratory studies, ordering and review of radiographic studies, ordering and performing treatments and interventions, pulse oximetry, re-evaluation of patient's condition and review of old charts     Medications Ordered in ED Medications  metoprolol tartrate (LOPRESSOR) injection 5 mg (5 mg Intravenous Given 08/28/23 1557)  sodium chloride 0.9 % bolus 500 mL (0 mLs Intravenous Stopped 08/28/23 1658)    ED Course/ Medical Decision Making/ A&P Clinical Course as of 08/28/23 1844  Sun Aug 28, 2023  1739 Troponin normal.   Magnesium normal.  CBC and metabolic panel normal [JK]  1739 Chest x-ray without acute findings [JK]  1744 Patient appears to be back in sinus rhythm on the monitor [JK]  1826 Troponin I (High Sensitivity)(!) Second troponin increased at 55 [JK]  1841 Heart rate remains in the 50s.  Patient does not have any chest pain or discomfort [JK]    Clinical Course User Index [JK] Linwood Dibbles, MD         CHA2DS2-VASc Score: 2  Medical Decision Making Amount and/or Complexity of Data Reviewed Labs: ordered. Decision-making details documented in ED Course.  Risk Prescription drug management.   Patient presented to the ED with complaints of palpitations tachycardia lightheadedness.  Patient has history of paroxysmal atrial fibrillation.  Patient was noted to be in rapid A-fib on arrival in the ED.  Patient mentions some mild chest discomfort but it primarily was lightheadedness and palpitations.  Patient was treated with metoprolol.  Heart rate decreased and patient ended up spontaneously converting back into sinus rhythm.  He was monitored in the ED and had no recurrent episodes.  Patient remained asymptomatic.  Initial troponin was normal.  Second troponin was increased at 55.  I suspect this is related to the tachycardia earlier.  He is currently not having any chest pain or discomfort.  I have low suspicion for acute coronary syndrome.  Will plan on discharge home with metoprolol.  Patient had not been taking that.  Will have him follow-up with his cardiologist to be rechecked.  Warning signs and precautions discussed        Final Clinical Impression(s) / ED Diagnoses Final diagnoses:  Atrial fibrillation with rapid ventricular response (HCC)    Rx / DC Orders ED Discharge Orders          Ordered    metoprolol tartrate (LOPRESSOR) 25 MG tablet  2 times daily PRN        08/28/23 1843    Ambulatory referral to Cardiology       Comments: If you have not heard from  the Cardiology office within the next 72 hours please call [phone].   08/28/23 1844              Linwood Dibbles, MD 08/28/23 1844

## 2023-08-28 NOTE — ED Notes (Signed)
X-ray at bedside

## 2023-08-28 NOTE — Discharge Instructions (Signed)
Start taking your metoprolol again if you have any recurrent palpitations.  Follow-up with your cardiologist to be rechecked.  Return to the ED for chest pain or any recurrent symptoms

## 2023-09-09 NOTE — Progress Notes (Unsigned)
Cardiology Office Note:   Date:  09/12/2023  ID:  Darren Pugh, DOB [birth year], MRN [id number] PCP:  Patient, No Pcp Per  [facility] Providers Cardiologist:  Alverda Skeans, MD Referring MD: Orbie Pyo, MD   Chief Complaint/Reason for Referral: Cardiology follow-up ASSESSMENT:    1. PAF (paroxysmal atrial fibrillation) (HCC)   2. Acute coronary syndrome (HCC)   3. Hyperlipidemia LDL goal <70   4. Coronary artery disease involving native coronary artery of native heart without angina pectoris   5. Elevated blood pressure reading   6. Aortic dilatation (HCC)     PLAN:   In order of problems listed above: 1.  Paroxysmal atrial fibrillation: Continue Eliquis and metoprolol.  The patient is interested in ablation procedure possibly.  Will refer to EP for further recommendations. 2.  Acute coronary syndrome: His troponins were only mildly elevated.  This likely represents a type II myocardial infarction.  He has had no worrisome angina.  Will continue medical therapy. 3.  Hyperlipidemia: Restart atorvastatin 20mg ; check lipid panel and LFTs in 2 months. 4.  Coronary artery disease: Continue Eliquis and statin. 5.  Elevated blood pressure reading: Diastolic blood pressure is above goal; start losartan 25mg  q Day, check BMP next week. 6.  Aortic dilatation: Will obtain CTA to evaluate further.      Dispo:  Return in about 6 months (around 03/12/2024).      Medication Adjustments/Labs and Tests Ordered: Current medicines are reviewed at length with the patient today.  Concerns regarding medicines are outlined above.  The following changes have been made:    Labs/tests ordered: Orders Placed This Encounter  Procedures   CT ANGIO CHEST AORTA W/CM & OR WO/CM   Basic metabolic panel   Hepatic function panel   Lipid panel   Ambulatory referral to Cardiac Electrophysiology   EKG 12-Lead    Medication Changes: Meds ordered this encounter  Medications   metoprolol  tartrate (LOPRESSOR) 25 MG tablet    Sig: Take 1 tablet (25 mg total) by mouth 2 (two) times daily.   losartan (COZAAR) 25 MG tablet    Sig: Take 1 tablet (25 mg total) by mouth daily.    Dispense:  90 tablet    Refill:  3   atorvastatin (LIPITOR) 20 MG tablet    Sig: Take 1 tablet (20 mg total) by mouth daily.    Dispense:  90 tablet    Refill:  3    Current medicines are reviewed at length with the patient today.  The patient does not have concerns regarding medicines.  History of Present Illness:      FOCUSED PROBLEM LIST:   Mild obstructive coronary artery disease on cardiac catheterization 2019 with 30% LAD lesion Hyperlipidemia; low LP(a) Paroxysmal atrial fibrillation; CV2 score 1  S/p remote TEE-CV On Eliquis ER October 2024 for rapid atrial fibrillation with mildly elevated troponin Aortic dilatation on TTE 2023    November 2023: The patient is a 72 y.o. male with the indicated medical history here for establish cardiovascular care.  The patient sometimes works out of state.  He has been off a lot of his medications for some time.  He is here with his wife.  He denies any exertional angina or exertional dyspnea.  He has had palpitations maybe 3 times within the last month or so.  They are not associated with presyncope, chest pain, or shortness of breath.  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  He has not required hospitalizations or emergency room visits for any reason.  He does not smoke.  He did notice some transient hematuria but he believes he was passing a kidney stone.  It has not happened within the last several weeks.  Plan: Start Eliquis, start atorvastatin 20 mg and check lipid panel, LFTs, LP(a) in 2 months.  Obtain monitor and echocardiogram.   October 2024: In the interim the patient had an 8-day monitor which demonstrated no atrial fibrillation.  His echocardiogram demonstrated preserved LV function with no significant valvular abnormalities.   LDL was less than 55.  He presented to the emergency department last month with chest pain and palpitations.  He was found to be in rapid atrial fibrillation.  He was treated with metoprolol and converted to sinus rhythm.  His troponin was minimally elevated to 55 on second measure.  His other laboratories were unremarkable.  The patient is here with his daughter.  He has not been taking his atorvastatin and missed a few doses of his apixaban.  He believes that his atrial fibrillation became an issue because he contracted COVID.  He also got an RSV vaccination.  Since his emergency room evaluation he has had some symptomatic palpitations.  1 time they lasted about 45 minutes.  They are not associated with lightheadedness, chest pain, or shortness of breath.  He is just bothered by them attic on occasion.  He has been taking his metoprolol faithfully.  He has had no signs or symptoms of stroke.  He denies any exertional angina.  He has had no issues with dyspnea, severe bleeding or bruising, paroxysmal nocturnal dyspnea, or signs or symptoms of stroke.     Current Medications: Current Meds  Medication Sig   losartan (COZAAR) 25 MG tablet Take 1 tablet (25 mg total) by mouth daily.   metoprolol tartrate (LOPRESSOR) 25 MG tablet Take 1 tablet (25 mg total) by mouth 2 (two) times daily.   [DISCONTINUED] metoprolol tartrate (LOPRESSOR) 25 MG tablet Take 1 tablet (25 mg total) by mouth 2 (two) times daily as needed.      Review of Systems:   Please see the history of present illness.    All other systems reviewed and are negative.      EKGs/Labs/Other Test Reviewed:   EKG: Serial EKGs that I reviewed from September demonstrate rapid atrial fibrillation and that resumption of normal sinus rhythm  EKG Interpretation Date/Time:  Monday September 12 2023 08:43:13 EDT Ventricular Rate:  56 PR Interval:  178 QRS Duration:  88 QT Interval:  428 QTC Calculation: 413 R Axis:   35  Text  Interpretation: Sinus bradycardia When compared with ECG of 28-Aug-2023 17:38, PREVIOUS ECG IS PRESENT Confirmed by Alverda Skeans ([id number]) on 09/12/2023 8:47:26 AM         Risk Assessment/Calculations:    CHA2DS2-VASc Score = 1   This indicates a 0.6% annual risk of stroke. The patient's score is based upon: CHF History: 0 HTN History: 0 Diabetes History: 0 Stroke History: 0 Vascular Disease History: 0 Age Score: 1 Gender Score: 0          Physical Exam:   VS:  BP 125/85   Pulse (!) 54   Ht 6\' 2"  (1.88 m)   Wt 187 lb 3.2 oz (84.9 kg)   SpO2 97%   BMI 24.04 kg/m        Wt Readings from Last 3 Encounters:  09/12/23 187 lb 3.2 oz (84.9 kg)  08/28/23 185  lb (83.9 kg)  10/13/22 185 lb 3.2 oz (84 kg)      GENERAL:  No apparent distress, AOx3 HEENT:  No carotid bruits, +2 carotid impulses, no scleral icterus CAR: RRR no murmurs, gallops, rubs, or thrills RES:  Clear to auscultation bilaterally ABD:  Soft, nontender, nondistended, positive bowel sounds x 4 VASC:  +2 radial pulses, +2 carotid pulses NEURO:  CN 2-12 grossly intact; motor and sensory grossly intact PSYCH:  No active depression or anxiety EXT:  No edema, ecchymosis, or cyanosis  Signed, Orbie Pyo, MD  09/12/2023 9:23 AM    [facility] [address] [place], [place], [place]  [address] Phone: [phone]; Fax: [phone]   Note:  This document was prepared using Dragon voice recognition software and may include unintentional dictation errors.

## 2023-09-12 ENCOUNTER — Encounter: Payer: Self-pay | Admitting: Internal Medicine

## 2023-09-12 ENCOUNTER — Ambulatory Visit: Payer: BC Managed Care – PPO | Attending: Internal Medicine | Admitting: Internal Medicine

## 2023-09-12 VITALS — BP 125/85 | HR 54 | Ht 74.0 in | Wt 187.2 lb

## 2023-09-12 DIAGNOSIS — I249 Acute ischemic heart disease, unspecified: Secondary | ICD-10-CM

## 2023-09-12 DIAGNOSIS — I2510 Atherosclerotic heart disease of native coronary artery without angina pectoris: Secondary | ICD-10-CM

## 2023-09-12 DIAGNOSIS — E785 Hyperlipidemia, unspecified: Secondary | ICD-10-CM | POA: Diagnosis not present

## 2023-09-12 DIAGNOSIS — R030 Elevated blood-pressure reading, without diagnosis of hypertension: Secondary | ICD-10-CM

## 2023-09-12 DIAGNOSIS — I77819 Aortic ectasia, unspecified site: Secondary | ICD-10-CM

## 2023-09-12 DIAGNOSIS — I480 Paroxysmal atrial fibrillation: Secondary | ICD-10-CM

## 2023-09-12 MED ORDER — LOSARTAN POTASSIUM 25 MG PO TABS: 25.0000 mg | ORAL_TABLET | Freq: Every day | ORAL | 3 refills | Status: AC

## 2023-09-12 MED ORDER — METOPROLOL TARTRATE 25 MG PO TABS: 25.0000 mg | ORAL_TABLET | Freq: Two times a day (BID) | ORAL | Status: DC

## 2023-09-12 MED ORDER — ATORVASTATIN CALCIUM 20 MG PO TABS: 20.0000 mg | ORAL_TABLET | Freq: Every day | ORAL | 3 refills | Status: AC

## 2023-09-12 NOTE — Patient Instructions (Signed)
Medication Instructions:  Your physician has recommended you make the following change in your medication:   1) START losartan 25 mg daily 2) START atorvastatin 20 mg daily 3) CHANGE metoprolol tartrate (Lopressor) to 25 mg twice daily  *If you need a refill on your cardiac medications before your next appointment, please call your pharmacy*  Lab Work: In 1 week: BMP In 2 months: fasting Lipid panel and LFTs If you have labs (blood work) drawn today and your tests are completely normal, you will receive your results only by: MyChart Message (if you have MyChart) OR A paper copy in the mail If you have any lab test that is abnormal or we need to change your treatment, we will call you to review the results.  Testing/Procedures: Your physician has recommended you have a Cardiac CT Angiography (CTA) for aortic dilatation. This a special type of CT scan that uses a computer to produce multi-dimensional views of major blood vessels throughout the body. In CT angiography, a contrast material is injected through an IV to help visualize the blood vessels   Follow-Up: At [facility], you and your health needs are our priority.  As part of our continuing mission to provide you with exceptional heart care, we have created designated Provider Care Teams.  These Care Teams include your primary Cardiologist (physician) and Advanced Practice Providers (APPs -  Physician Assistants and Nurse Practitioners) who all work together to provide you with the care you need, when you need it.  Your next appointment:   6 month(s)  The format for your next appointment:   In Person  Provider:   Jari Favre, PA-C, Ronie Spies, PA-C, or Robin Searing, NP       Other Instructions You have been referred to one of our electrophysiologists, Dr. Michele Rockers, to discuss atrial fibrillation ablation. A scheduler will call you to schedule an appointment to see Dr. Jimmey Ralph.

## 2023-09-19 ENCOUNTER — Ambulatory Visit: Payer: BC Managed Care – PPO

## 2023-09-19 ENCOUNTER — Ambulatory Visit (HOSPITAL_COMMUNITY)
Admission: RE | Admit: 2023-09-19 | Discharge: 2023-09-19 | Disposition: A | Discharge status: Home / Self Care | Payer: BC Managed Care – PPO | Source: Ambulatory Visit | Attending: Internal Medicine | Admitting: Internal Medicine

## 2023-09-19 DIAGNOSIS — I480 Paroxysmal atrial fibrillation: Secondary | ICD-10-CM | POA: Insufficient documentation

## 2023-09-19 DIAGNOSIS — I2510 Atherosclerotic heart disease of native coronary artery without angina pectoris: Secondary | ICD-10-CM | POA: Diagnosis not present

## 2023-09-19 DIAGNOSIS — D3500 Benign neoplasm of unspecified adrenal gland: Secondary | ICD-10-CM | POA: Diagnosis not present

## 2023-09-19 DIAGNOSIS — R918 Other nonspecific abnormal finding of lung field: Secondary | ICD-10-CM | POA: Diagnosis not present

## 2023-09-19 DIAGNOSIS — I249 Acute ischemic heart disease, unspecified: Secondary | ICD-10-CM | POA: Insufficient documentation

## 2023-09-19 DIAGNOSIS — J929 Pleural plaque without asbestos: Secondary | ICD-10-CM | POA: Diagnosis not present

## 2023-09-19 DIAGNOSIS — I77819 Aortic ectasia, unspecified site: Secondary | ICD-10-CM | POA: Diagnosis not present

## 2023-09-19 DIAGNOSIS — R030 Elevated blood-pressure reading, without diagnosis of hypertension: Secondary | ICD-10-CM

## 2023-09-19 MED ORDER — IOHEXOL 350 MG/ML SOLN
80.0000 mL | Freq: Once | INTRAVENOUS | Status: AC | PRN
  Administered 2023-09-19: 80 mL via INTRAVENOUS

## 2023-09-20 LAB — BASIC METABOLIC PANEL
BUN/Creatinine Ratio: 12 (ref 10–24)
BUN: 11 mg/dL (ref 8–27)
CO2: 25 mmol/L (ref 20–29)
Calcium: 9.1 mg/dL (ref 8.6–10.2)
Chloride: 106 mmol/L (ref 96–106)
Creatinine, Ser: 0.92 mg/dL (ref 0.76–1.27)
Glucose: 103 mg/dL — ABNORMAL HIGH (ref 70–99)
Potassium: 4.2 mmol/L (ref 3.5–5.2)
Sodium: 142 mmol/L (ref 134–144)
eGFR: 89 mL/min/{1.73_m2} (ref >59)

## 2023-10-04 NOTE — Progress Notes (Unsigned)
Electrophysiology Office Note:   Date:  10/05/2023  ID:  Darren Pugh, DOB Aug 13, 1951, MRN [id number]  Primary Cardiologist: Orbie Pyo, MD Electrophysiologist: Nobie Putnam, MD      History of Present Illness:   Darren Pugh is a 72 y.o. male with h/o hyperlipidemia and hypertension seen today for evaluation of paroxysmal atrial fibrillation at the request of Dr. Lynnette Caffey.   Patient presented to ED on 08/28/23 with a chief complaint of dizziness and lightheadedness. Also with palpitations. Was found to be in atrial fibrillation with RVR. He spontaneously converted in the ED. He thinks episode got worse after having COVID infection.  He is very symptomatic during these episodes with rapid heartbeats, fatigue, presyncope.  He has been taking metoprolol and Eliquis, but would like to come off metoprolol if possible.  Review of systems complete and found to be negative unless listed in HPI.   EP Information / Studies Reviewed:   EKG 09/12/23: Sinus rhythm       EKG 08/28/23:    Echo 11/02/22:  LVEF 60-65%. G1DD.  Normal RV size and function.  No significant valvular disease.  Ascending aorta dilatation, 39mm. 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   Risk Assessment/Calculations:    CHA2DS2-VASc Score = 2   This indicates a 2.2% annual risk of stroke. The patient's score is based upon: CHF History: 0 HTN History: 1 Diabetes History: 0 Stroke History: 0 Vascular Disease History: 0 Age Score: 1 Gender Score: 0       Physical Exam:   VS:  BP (!) 144/88   Pulse 61   Ht 6\' 2"  (1.88 m)   Wt 192 lb 12.8 oz (87.5 kg)   SpO2 96%   BMI 24.75 kg/m    Wt Readings from Last 3 Encounters:  10/05/23 192 lb 12.8 oz (87.5 kg)  09/12/23 187 lb 3.2 oz (84.9 kg)  08/28/23 185 lb (83.9 kg)     GEN: Well nourished, well developed in no acute distress NECK: No JVD; No carotid bruits CARDIAC: Normal rate and regular rhythm. RESPIRATORY:  Clear to auscultation without rales, wheezing  or rhonchi  ABDOMEN: Soft, non-tender, non-distended EXTREMITIES:  No edema; No deformity   ASSESSMENT AND PLAN:   Darren Pugh is a 72 y.o. male with h/o hyperlipidemia and hypertension seen today for evaluation of paroxysmal atrial fibrillation at the request of Dr. Lynnette Caffey.   #Paroxysmal atrial fibrillation: Symptomatic. - Continue metoprolol for now.  -Discussed treatment options today for AF including antiarrhythmic drug therapy and ablation. Discussed risks, recovery and likelihood of success with each treatment strategy. Risk, benefits, and alternatives to EP study and ablation for afib were discussed. These risks include but are not limited to stroke, bleeding, vascular damage, tamponade, perforation, damage to the esophagus, lungs, phrenic nerve and other structures, pulmonary vein stenosis, worsening renal function, coronary vasospasm and death.  Discussed potential need for repeat ablation procedures and antiarrhythmic drugs after an initial ablation. The patient understands these risk and wishes to proceed.  We will therefore proceed with catheter ablation at the next available time.  Carto, ICE, anesthesia are requested for the procedure.  Will also obtain CT PV protocol prior to the procedure to exclude LAA thrombus and further evaluate atrial anatomy.  #Secondary hypercoagulable state due to atrial fibrillation:  -CHADSVASC score of 2.  -Continue Eliquis.  #Hypertension Above goal today.  Recommend checking blood pressures 1-2 times per week at home and recording the values.  Recommend bringing these recordings to the  primary care physician.   Signed, Nobie Putnam, MD

## 2023-10-05 ENCOUNTER — Ambulatory Visit: Payer: BC Managed Care – PPO | Attending: Cardiology | Admitting: Cardiology

## 2023-10-05 ENCOUNTER — Encounter: Payer: Self-pay | Admitting: Cardiology

## 2023-10-05 VITALS — BP 144/88 | HR 61 | Ht 74.0 in | Wt 192.8 lb

## 2023-10-05 DIAGNOSIS — I480 Paroxysmal atrial fibrillation: Secondary | ICD-10-CM | POA: Diagnosis not present

## 2023-10-05 DIAGNOSIS — I10 Essential (primary) hypertension: Secondary | ICD-10-CM | POA: Diagnosis not present

## 2023-10-05 DIAGNOSIS — D6869 Other thrombophilia: Secondary | ICD-10-CM

## 2023-10-05 NOTE — Patient Instructions (Signed)
Medication Instructions:  Your physician recommends that you continue on your current medications as directed. Please refer to the Current Medication list given to you today. *If you need a refill on your cardiac medications before your next appointment, please call your pharmacy*   Testing/Procedures: Atrial Fibrillation Ablation - contact our office to schedule when you're ready Your physician has recommended that you have an ablation. Catheter ablation is a medical procedure used to treat some cardiac arrhythmias (irregular heartbeats). During catheter ablation, a long, thin, flexible tube is put into a blood vessel in your groin (upper thigh), or neck. This tube is called an ablation catheter. It is then guided to your heart through the blood vessel. Radio frequency waves destroy small areas of heart tissue where abnormal heartbeats may cause an arrhythmia to start. Please see the instruction sheet given to you today.   Follow-Up: At [facility], you and your health needs are our priority.  As part of our continuing mission to provide you with exceptional heart care, we have created designated Provider Care Teams.  These Care Teams include your primary Cardiologist (physician) and Advanced Practice Providers (APPs -  Physician Assistants and Nurse Practitioners) who all work together to provide you with the care you need, when you need it.  We recommend signing up for the patient portal called "MyChart".  Sign up information is provided on this After Visit Summary.  MyChart is used to connect with patients for Virtual Visits (Telemedicine).  Patients are able to view lab/test results, encounter notes, upcoming appointments, etc.  Non-urgent messages can be sent to your provider as well.   To learn more about what you can do with MyChart, go to [email or web address].    Your next appointment:   Follow up after your ablation   Provider:   Nobie Putnam, MD

## 2023-11-11 ENCOUNTER — Encounter: Payer: Self-pay | Admitting: *Deleted

## 2023-11-14 ENCOUNTER — Ambulatory Visit: Payer: BC Managed Care – PPO

## 2023-11-14 DIAGNOSIS — E785 Hyperlipidemia, unspecified: Secondary | ICD-10-CM

## 2023-11-22 ENCOUNTER — Encounter (HOSPITAL_COMMUNITY): Payer: Self-pay | Admitting: Emergency Medicine

## 2023-11-22 ENCOUNTER — Other Ambulatory Visit: Payer: Self-pay

## 2023-11-22 ENCOUNTER — Emergency Department (HOSPITAL_COMMUNITY)
Admission: EM | Admit: 2023-11-22 | Discharge: 2023-11-22 | Discharge status: Against Medical Advice | Payer: BC Managed Care – PPO | Attending: Emergency Medicine | Admitting: Emergency Medicine

## 2023-11-22 ENCOUNTER — Emergency Department (HOSPITAL_COMMUNITY): Payer: BC Managed Care – PPO

## 2023-11-22 DIAGNOSIS — Z5321 Procedure and treatment not carried out due to patient leaving prior to being seen by health care provider: Secondary | ICD-10-CM | POA: Insufficient documentation

## 2023-11-22 DIAGNOSIS — R000 Tachycardia, unspecified: Secondary | ICD-10-CM | POA: Diagnosis not present

## 2023-11-22 DIAGNOSIS — R0602 Shortness of breath: Secondary | ICD-10-CM | POA: Insufficient documentation

## 2023-11-22 DIAGNOSIS — R079 Chest pain, unspecified: Secondary | ICD-10-CM | POA: Diagnosis not present

## 2023-11-22 DIAGNOSIS — I499 Cardiac arrhythmia, unspecified: Secondary | ICD-10-CM | POA: Diagnosis not present

## 2023-11-22 DIAGNOSIS — I700 Atherosclerosis of aorta: Secondary | ICD-10-CM | POA: Diagnosis not present

## 2023-11-22 LAB — CBC WITH DIFFERENTIAL/PLATELET
Abs Immature Granulocytes: 0.03 10*3/uL (ref 0.00–0.07)
Basophils Absolute: 0 10*3/uL (ref 0.0–0.1)
Basophils Relative: 0 %
Eosinophils Absolute: 0.1 10*3/uL (ref 0.0–0.5)
Eosinophils Relative: 1 %
HCT: 41.4 % (ref 39.0–52.0)
Hemoglobin: 14.3 g/dL (ref 13.0–17.0)
Immature Granulocytes: 1 %
Lymphocytes Relative: 16 %
Lymphs Abs: 0.8 10*3/uL (ref 0.7–4.0)
MCH: 33.3 pg (ref 26.0–34.0)
MCHC: 34.5 g/dL (ref 30.0–36.0)
MCV: 96.5 fL (ref 80.0–100.0)
Monocytes Absolute: 0.4 10*3/uL (ref 0.1–1.0)
Monocytes Relative: 8 %
Neutro Abs: 3.6 10*3/uL (ref 1.7–7.7)
Neutrophils Relative %: 74 %
Platelets: 189 10*3/uL (ref 150–400)
RBC: 4.29 MIL/uL (ref 4.22–5.81)
RDW: 12.1 % (ref 11.5–15.5)
WBC: 4.9 10*3/uL (ref 4.0–10.5)
nRBC: 0 % (ref 0.0–0.2)

## 2023-11-22 LAB — BASIC METABOLIC PANEL
Anion gap: 7 (ref 5–15)
BUN: 14 mg/dL (ref 8–23)
CO2: 23 mmol/L (ref 22–32)
Calcium: 9 mg/dL (ref 8.9–10.3)
Chloride: 106 mmol/L (ref 98–111)
Creatinine, Ser: 0.85 mg/dL (ref 0.61–1.24)
GFR, Estimated: >60 mL/min (ref >60)
Glucose, Bld: 119 mg/dL — ABNORMAL HIGH (ref 70–99)
Potassium: 3.8 mmol/L (ref 3.5–5.1)
Sodium: 136 mmol/L (ref 135–145)

## 2023-11-22 LAB — TROPONIN I (HIGH SENSITIVITY)
Troponin I (High Sensitivity): 5 ng/L (ref <18)
Troponin I (High Sensitivity): 7 ng/L (ref <18)

## 2023-11-22 NOTE — ED Notes (Signed)
Pt left ed at this time

## 2023-11-22 NOTE — ED Provider Triage Note (Signed)
Emergency Medicine Provider Triage Evaluation Note  Darren Pugh , a 72 y.o. male  was evaluated in triage.  Pt complains of episode of rapid heart rate with shortness of breath, near syncope, now resolved. History of atrial fib with RVR. ECG obtained upon arrival reveals same. Ablation recommended by cardiology and patient is considering. Denies chest pain  Review of Systems  Positive: Rapid heart rate, shortness of breath (now resolved) Negative: Chest pain  Physical Exam  BP (!) 120/95   Pulse 77   Temp (!) 97.4 F (36.3 C) (Oral)   Resp 16   Ht 6\' 2"  (1.88 m)   Wt 87.5 kg   SpO2 100%   BMI 24.77 kg/m  Gen:   Awake, no distress   Resp:  Normal effort, lungs CTA bilaterally  MSK:   Moves extremities without difficulty  Other:    Medical Decision Making  Medically screening exam initiated at 3:50 PM.  Appropriate orders placed.  Darren Pugh was informed that the remainder of the evaluation will be completed by another provider, this initial triage assessment does not replace that evaluation, and the importance of remaining in the ED until their evaluation is complete.     Darren Morn, NP 11/22/23 1556

## 2023-11-22 NOTE — ED Triage Notes (Signed)
Pt here for a-fib states "everything went white and my heart stopped", states he has a hx of a-fib and reports this feeling when he has high heart rate. Reports ShOB with episode.

## 2023-12-02 ENCOUNTER — Other Ambulatory Visit: Payer: Self-pay | Admitting: Internal Medicine

## 2023-12-04 ENCOUNTER — Other Ambulatory Visit: Payer: Self-pay | Admitting: Internal Medicine

## 2023-12-05 NOTE — Telephone Encounter (Signed)
Prescription refill request for Eliquis received. Indication:afib Last office visit:10/24 Scr:0.85  12/24 Age: 72 Weight:87.5  kg  Prescription refilled

## 2024-02-01 ENCOUNTER — Other Ambulatory Visit: Payer: Self-pay | Admitting: Internal Medicine

## 2024-02-01 NOTE — Telephone Encounter (Signed)
 Pt is out of medicatio

## 2024-02-29 ENCOUNTER — Other Ambulatory Visit: Payer: Self-pay | Admitting: Internal Medicine

## 2024-03-25 NOTE — Progress Notes (Deleted)
 Cardiology Office Note    Darren Pugh Name: Darren Pugh Date of Encounter: 03/25/2024  Primary Care Provider:  Patient, No Pcp Per Primary Cardiologist:  [name], MD Primary Electrophysiologist: [name], MD   Past Medical History    Past Medical History:  Diagnosis Date   Allergy    History of hyperlipidemia    PAF (paroxysmal atrial fibrillation) (HCC) 2009    History of Present Illness  Darren Pugh is a 73 y.o. male with a PMH of CAD, PAF, HLD who presents today for 22-month follow-up.  Darren Pugh was seen initially by Dr. [name] and reestablished with Dr. [name] in 10/2022.  During his visit he denied any exertional chest pain but noted palpitations that occurred 3 times in the last month.  He was started on Eliquis  as well as atorvastatin  20 mg.  And a 2D echo for further eval ration that showed EF of 60 to 65% with no RWMA and mildly elevated PASP with no valve abnormalities.  He wore an event monitor that shows 6 episodes of SVT with an average rate of 193 and no evidence of AF or bradycardia arrhythmia.  He was advised to continue current medical therapy at that time.  He was last seen on 09/12/2023 and indicated interest in possible AF ablation with referral placed to EP.  He was also noted to have elevated BP and was started on losartan  25 mg and underwent CTA of the chest due to aortic dilation that showed no aortic aneurysms but revealed some pulmonary nodules with advisement to follow-up with PCP.  He was seen by Dr. [name] on 10/05/2023 and was recommended for AF ablation.  He was seen in the ED on 11/22/2023 for atrial fibrillation.  He completed EKG that showed AF with RVR that resolved spontaneously and Darren Pugh left ED before further workup.  Labs were unremarkable prior to discharge.  Darren Pugh denies chest pain, palpitations, dyspnea, PND, orthopnea, nausea, vomiting, dizziness, syncope, edema, weight gain, or early satiety.   Discussed the use of AI scribe  software for clinical note transcription with the Darren Pugh, who gave verbal consent to proceed.  History of Present Illness    ***Notes: -Last ischemic evaluation:  Review of Systems  Please see the history of present illness.    All other systems reviewed and are otherwise negative except as noted above.  Physical Exam    Wt Readings from Last 3 Encounters:  11/22/23 192 lb 14.4 oz (87.5 kg)  10/05/23 192 lb 12.8 oz (87.5 kg)  09/12/23 187 lb 3.2 oz (84.9 kg)   WJ:XBJYN were no vitals filed for this visit.,There is no height or weight on file to calculate BMI. GEN: Well nourished, well developed in no acute distress Neck: No JVD; No carotid bruits Pulmonary: Clear to auscultation without rales, wheezing or rhonchi  Cardiovascular: Normal rate. Regular rhythm. Normal S1. Normal S2.   Murmurs: There is no murmur.  ABDOMEN: Soft, non-tender, non-distended EXTREMITIES:  No edema; No deformity   EKG/LABS/ Recent Cardiac Studies   ECG personally reviewed by me today - ***  Risk Assessment/Calculations:   {Does this Darren Pugh have ATRIAL FIBRILLATION?[phone]}      Lab Results  Component Value Date   WBC 4.9 11/22/2023   HGB 14.3 11/22/2023   HCT 41.4 11/22/2023   MCV 96.5 11/22/2023   PLT 189 11/22/2023   Lab Results  Component Value Date   CREATININE 0.85 11/22/2023   BUN 14 11/22/2023   NA 136 11/22/2023  K 3.8 11/22/2023   CL 106 11/22/2023   CO2 23 11/22/2023   Lab Results  Component Value Date   CHOL 82 (L) 12/20/2022   HDL 33 (L) 12/20/2022   LDLCALC 35 12/20/2022   TRIG 63 12/20/2022   CHOLHDL 2.5 12/20/2022    No results found for: "HGBA1C" Assessment & Plan    1.  Paroxysmal AF: -  2.  Primary HTN:  3.  CAD:  4.  Hyperlipidemia:  Disposition: Follow-up with [name], MD or APP in *** months {Are you ordering a CV Procedure (e.g. stress test, cath, DCCV, TEE, etc)?   Press F2        :[id number]}   Signed, Francene Ing, Retha Cast,  NP 03/25/2024, 4:09 PM [facility]

## 2024-03-26 ENCOUNTER — Ambulatory Visit: Payer: BC Managed Care – PPO | Attending: Nurse Practitioner | Admitting: Nurse Practitioner

## 2024-03-26 DIAGNOSIS — I10 Essential (primary) hypertension: Secondary | ICD-10-CM

## 2024-03-26 DIAGNOSIS — I480 Paroxysmal atrial fibrillation: Secondary | ICD-10-CM

## 2024-03-26 DIAGNOSIS — I2510 Atherosclerotic heart disease of native coronary artery without angina pectoris: Secondary | ICD-10-CM

## 2024-03-26 DIAGNOSIS — E785 Hyperlipidemia, unspecified: Secondary | ICD-10-CM

## 2024-07-22 NOTE — Progress Notes (Deleted)
 Cardiology Office Note:   Date:  07/22/2024  ID:  Darren Pugh, DOB 1951/08/17, MRN [id number] PCP:  Patient, No Pcp Per  [facility] Providers Cardiologist:  Wendel Haws, MD Referring MD: No ref. provider found   Chief Complaint/Reason for Referral: Follow-up atrial fibrillation ASSESSMENT:    1. PAF (paroxysmal atrial fibrillation) (HCC)   2. Hypercoagulable state due to paroxysmal atrial fibrillation (HCC)   3. Coronary artery disease involving native coronary artery of native heart without angina pectoris   4. Primary hypertension   5. Hyperlipidemia LDL goal <70   6. Aortic atherosclerosis (HCC)   7. CKD (chronic kidney disease) stage 2, GFR 60-89 ml/min      PLAN:   In order of problems listed above: PAF: Continue Eliquis  5 twice daily, Lopressor  25 twice daily, refer to EP?*** Hypercoagulable state: Continue Eliquis  5 twice daily. CAD: Mild, continue Eliquis  5 5 twice daily, atorvastatin  20 daily  HTN: Continue losartan  25 mg, Lopressor  25 mg twice daily*** HL: Check lipid panel and LFTs today continue atorvastatin  20 *** Aortic atherosclerosis: Continue atorvastatin  20, Eliquis  5 twice daily CKD stage II: Continue losartan  25 mg and start Jardiance 10 mg***  I spent *** minutes reviewing all clinical data during and prior to this visit including all relevant imaging studies, laboratories, clinical information from other health systems and prior notes from both Cardiology and other specialties, interviewing the patient, conducting a complete physical examination, and coordinating care in order to formulate a comprehensive and personalized evaluation and treatment plan.      Dispo:  No follow-ups on file.      Medication Adjustments/Labs and Tests Ordered: Current medicines are reviewed at length with the patient today.  Concerns regarding medicines are outlined above.  The following changes have been made:    Labs/tests ordered: No orders of the defined  types were placed in this encounter.   Medication Changes: No orders of the defined types were placed in this encounter.   Current medicines are reviewed at length with the patient today.  The patient does not have concerns regarding medicines.  History of Present Illness:      FOCUSED PROBLEM LIST:   CAD 30% LAD lesion; coronary angiography 2019 Hyperlipidemia LP(a) 43.3 Aortic atherosclerosis Chest CT 2024 Hypertension PAF  S/p remote TEE-CV On Eliquis  ER October 2024 for rapid atrial fibrillation with mildly elevated troponin Aortic dilatation  TTE 2023 No aneurysm chest CT 2024 CKD stage II BMI 29 October 2022: The patient is a 73 y.o. male with the indicated medical history here for establish cardiovascular care.  The patient sometimes works out of state.  He has been off a lot of his medications for some time.  He is here with his wife.  He denies any exertional angina or exertional dyspnea.  He has had palpitations maybe 3 times within the last month or so.  They are not associated with presyncope, chest pain, or shortness of breath.  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  He has not required hospitalizations or emergency room visits for any reason.  He does not smoke.  He did notice some transient hematuria but he believes he was passing a kidney stone.  It has not happened within the last several weeks.  Plan: Start Eliquis , start atorvastatin  20 mg and check lipid panel, LFTs, LP(a) in 2 months.  Obtain monitor and echocardiogram.   October 2024: In the interim the patient had an 8-day monitor  which demonstrated no atrial fibrillation.  His echocardiogram demonstrated preserved LV function with no significant valvular abnormalities.  LDL was less than 55.  He presented to the emergency department last month with chest pain and palpitations.  He was found to be in rapid atrial fibrillation.  He was treated with metoprolol  and converted to sinus rhythm.   His troponin was minimally elevated to 55 on second measure.  His other laboratories were unremarkable.  The patient is here with his daughter.  He has not been taking his atorvastatin  and missed a few doses of his apixaban .  He believes that his atrial fibrillation became an issue because he contracted COVID.  He also got an RSV vaccination.  Since his emergency room evaluation he has had some symptomatic palpitations.  1 time they lasted about 45 minutes.  They are not associated with lightheadedness, chest pain, or shortness of breath.  He is just bothered by them attic on occasion.  He has been taking his metoprolol  faithfully.  He has had no signs or symptoms of stroke.  He denies any exertional angina.  He has had no issues with dyspnea, severe bleeding or bruising, paroxysmal nocturnal dyspnea, or signs or symptoms of stroke.  Plan: Restart atorvastatin , check lipid panel and LFTs in 2 months, start losartan  25 mg, obtain CTA for aortic dilatation, will refer to EP for possible PVI.  August 2025:  Patient consents to use of AI scribe. In the interim he had a chest CT which demonstrated no aortic aneurysm.  He saw Dr. Kennyth EP and plans were in place to pursue atrial fibrillation ablation.  Does not look like that was ultimately performed.     Current Medications: No outpatient medications have been marked as taking for the 07/27/24 encounter (Appointment) with Derian Pfost K, MD.      Review of Systems:   Please see the history of present illness.    All other systems reviewed and are negative.      EKGs/Labs/Other Test Reviewed:   EKG: Serial EKGs that I reviewed from September demonstrate rapid atrial fibrillation and that resumption of normal sinus rhythm  EKG Interpretation Date/Time:    Ventricular Rate:    PR Interval:    QRS Duration:    QT Interval:    QTC Calculation:   R Axis:      Text Interpretation:           Risk Assessment/Calculations:    CHA2DS2-VASc  Score = 2   This indicates a 2.2% annual risk of stroke. The patient's score is based upon: CHF History: 0 HTN History: 1 Diabetes History: 0 Stroke History: 0 Vascular Disease History: 0 Age Score: 1 Gender Score: 0   {This patient has a significant risk of stroke if diagnosed with atrial fibrillation.  Please consider VKA or DOAC agent for anticoagulation if the bleeding risk is acceptable.   You can also use the SmartPhrase .[email or web address] for documentation.   :[id number]}      Physical Exam:   VS:  There were no vitals taken for this visit.   No BP recorded.  {Refresh Note OR Click here to enter BP  :1}***   Wt Readings from Last 3 Encounters:  11/22/23 192 lb 14.4 oz (87.5 kg)  10/05/23 192 lb 12.8 oz (87.5 kg)  09/12/23 187 lb 3.2 oz (84.9 kg)      GENERAL:  No apparent distress, AOx3 HEENT:  No carotid bruits, +2 carotid impulses, no scleral icterus CAR: RRR no  murmurs, gallops, rubs, or thrills RES:  Clear to auscultation bilaterally ABD:  Soft, nontender, nondistended, positive bowel sounds x 4 VASC:  +2 radial pulses, +2 carotid pulses NEURO:  CN 2-12 grossly intact; motor and sensory grossly intact PSYCH:  No active depression or anxiety EXT:  No edema, ecchymosis, or cyanosis  Signed, Ihor Meinzer K Savior Himebaugh, MD  07/22/2024 7:56 AM    [facility] [address] [place], [place], [place]  [address] Phone: [phone]; Fax: [phone]   Note:  This document was prepared using Dragon voice recognition software and may include unintentional dictation errors.

## 2024-07-27 ENCOUNTER — Ambulatory Visit: Attending: Internal Medicine | Admitting: Internal Medicine

## 2024-07-27 DIAGNOSIS — D6869 Other thrombophilia: Secondary | ICD-10-CM

## 2024-07-27 DIAGNOSIS — E785 Hyperlipidemia, unspecified: Secondary | ICD-10-CM

## 2024-07-27 DIAGNOSIS — I480 Paroxysmal atrial fibrillation: Secondary | ICD-10-CM

## 2024-07-27 DIAGNOSIS — I10 Essential (primary) hypertension: Secondary | ICD-10-CM

## 2024-07-27 DIAGNOSIS — I2510 Atherosclerotic heart disease of native coronary artery without angina pectoris: Secondary | ICD-10-CM

## 2024-07-27 DIAGNOSIS — N182 Chronic kidney disease, stage 2 (mild): Secondary | ICD-10-CM

## 2024-07-27 DIAGNOSIS — I700 Atherosclerosis of aorta: Secondary | ICD-10-CM

## 2024-08-08 ENCOUNTER — Telehealth: Payer: Self-pay | Admitting: Cardiology

## 2024-08-08 NOTE — Telephone Encounter (Signed)
 After receiving a MyChart message from patient to schedule patient for an ablation, I reached back out to patient to see if that was to discuss an ablation or to schedule the ablation. Patient had not gotten back to since his MyChart message. Called and spoke with his daughter Stephane, since patient last discussed the ablation with Dr. Kennyth 09/2023, patient is scheduled with Dr. Kennyth on 9/30 to re-discuss. Dawn states that patient has some scheduled vacation time, and would like to know if they can go ahead and schedule the ablation. Advised I would forward that request to our EP Procedure Scheduler + Carly, RN.

## 2024-08-09 NOTE — Telephone Encounter (Signed)
 Attempted to call patient. Unable to leave voicemail.

## 2024-08-14 NOTE — Telephone Encounter (Signed)
 Called and spoke to pt's Daughter - Put him on the calendar for Afib Ablation with Dr. Kennyth on Wed. 10/29 at 1:30 pm.  He is scheduled to see Dr. Kennyth on 9/30 and can get labs and we will go over instructions at that time if Dr. Kennyth agrees to proceed with procedure.

## 2024-08-21 DIAGNOSIS — I440 Atrioventricular block, first degree: Secondary | ICD-10-CM | POA: Diagnosis not present

## 2024-08-21 DIAGNOSIS — R0789 Other chest pain: Secondary | ICD-10-CM | POA: Diagnosis not present

## 2024-08-21 DIAGNOSIS — R000 Tachycardia, unspecified: Secondary | ICD-10-CM | POA: Diagnosis not present

## 2024-08-21 DIAGNOSIS — I4891 Unspecified atrial fibrillation: Secondary | ICD-10-CM | POA: Diagnosis not present

## 2024-08-21 DIAGNOSIS — R42 Dizziness and giddiness: Secondary | ICD-10-CM | POA: Diagnosis not present

## 2024-08-21 DIAGNOSIS — R079 Chest pain, unspecified: Secondary | ICD-10-CM | POA: Diagnosis not present

## 2024-09-04 ENCOUNTER — Encounter: Payer: Self-pay | Admitting: Cardiology

## 2024-09-04 ENCOUNTER — Ambulatory Visit: Attending: Cardiology | Admitting: Cardiology

## 2024-09-04 VITALS — BP 118/66 | HR 58 | Ht 74.0 in | Wt 186.0 lb

## 2024-09-04 DIAGNOSIS — I10 Essential (primary) hypertension: Secondary | ICD-10-CM | POA: Diagnosis not present

## 2024-09-04 DIAGNOSIS — D6869 Other thrombophilia: Secondary | ICD-10-CM

## 2024-09-04 DIAGNOSIS — I480 Paroxysmal atrial fibrillation: Secondary | ICD-10-CM

## 2024-09-04 MED ORDER — APIXABAN 5 MG PO TABS: 5.0000 mg | ORAL_TABLET | Freq: Two times a day (BID) | ORAL | 3 refills | Status: DC

## 2024-09-04 MED ORDER — METOPROLOL TARTRATE 25 MG PO TABS: ORAL_TABLET | ORAL | 3 refills | Status: AC

## 2024-09-04 NOTE — Progress Notes (Signed)
 Electrophysiology Office Note:   Date:  09/04/2024  ID:  Darren Pugh, DOB 1951/01/03, MRN [id number]  Primary Cardiologist: [name], MD Electrophysiologist: Fonda Kitty, MD      History of Present Illness:   Darren Pugh is a 73 y.o. male with h/o hyperlipidemia and hypertension seen today for follow up evaluation of paroxysmal atrial fibrillation.  Discussed the use of AI scribe software for clinical note transcription with the patient, who gave verbal consent to proceed.  History of Present Illness Darren Pugh is a 73 year old male with atrial fibrillation who presents for follow-up regarding his condition and scheduled ablation procedure. He was initially referred by Dr. Wendel approximately 1 year ago for evaluation of atrial fibrillation.  Since our last visit, he has experienced approximately eight episodes of atrial fibrillation over the past year, leading to four emergency room visits where he was treated and released after a few hours. During these episodes, he experiences a racing heart rate that does not resolve spontaneously.  He has been using Eliquis  intermittently during episodes of atrial fibrillation but reports feeling a lack of energy while on the medication. Additionally, he takes metoprolol  twice daily to manage his condition.  When not in atrial fibrillation he is otherwise doing relatively well and has no new or acute complaints today.  Review of systems complete and found to be negative unless listed in HPI.   EP Information / Studies Reviewed:      EKG 08/28/23: AF   Echo 11/02/22:  LVEF 60-65%. G1DD.  Normal RV size and function.  No significant valvular disease.  Ascending aorta dilatation, 39mm. 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   Risk Assessment/Calculations:    CHA2DS2-VASc Score = 2   This indicates a 2.2% annual risk of stroke. The patient's score is based upon: CHF History: 0 HTN History: 1 Diabetes History: 0 Stroke  History: 0 Vascular Disease History: 0 Age Score: 1 Gender Score: 0       Physical Exam:   VS:  BP 118/66   Pulse (!) 58   Ht 6' 2 (1.88 m)   Wt 186 lb (84.4 kg)   SpO2 97%   BMI 23.88 kg/m    Wt Readings from Last 3 Encounters:  09/04/24 186 lb (84.4 kg)  11/22/23 192 lb 14.4 oz (87.5 kg)  10/05/23 192 lb 12.8 oz (87.5 kg)     GEN: Well nourished, well developed in no acute distress NECK: No JVD; No carotid bruits CARDIAC: Normal rate and regular rhythm. RESPIRATORY:  Clear to auscultation without rales, wheezing or rhonchi  ABDOMEN: Soft, non-tender, non-distended EXTREMITIES:  No edema; No deformity   ASSESSMENT AND PLAN:    #Paroxysmal atrial fibrillation: Symptomatic.  Resulting in multiple ED visits. - Continue metoprolol  12.5mg  BID for now.  -Discussed treatment options today for AF including antiarrhythmic drug therapy and ablation. Discussed risks, recovery and likelihood of success with each treatment strategy. Risk, benefits, and alternatives to EP study and ablation for afib were discussed. These risks include but are not limited to stroke, bleeding, vascular damage, tamponade, perforation, damage to the esophagus, lungs, phrenic nerve and other structures, pulmonary vein stenosis, worsening renal function, coronary vasospasm and death.  Discussed potential need for repeat ablation procedures and antiarrhythmic drugs after an initial ablation. The patient understands these risk and wishes to proceed.  We will therefore proceed with catheter ablation.  Carto, ICE, anesthesia are requested for the procedure.  Tentatively scheduled for 10/03/2024. -We will  not obtain CT PV protocol prior to the procedure to exclude LAA thrombus and further evaluate atrial anatomy.    #Secondary hypercoagulable state due to atrial fibrillation:  -CHADSVASC score of 2.  -Continue Eliquis  5mg  BID.  Emphasized compliance with daily twice daily dosing for the next month, then at least 60  days after ablation.  Given his low CHA2DS2-VASc, if he does not have recurrence after ablation, it would be reasonable to discontinue as long as we are monitoring closely for recurrence.  #Hypertension Above goal today.  Recommend checking blood pressures 1-2 times per week at home and recording the values.  Recommend bringing these recordings to the primary care physician.   Signed, Fonda Kitty, MD

## 2024-09-04 NOTE — Patient Instructions (Signed)
 Medication Instructions:  Your physician recommends that you continue on your current medications as directed. Please refer to the Current Medication list given to you today.  *If you need a refill on your cardiac medications before your next appointment, please call your pharmacy*  Lab Work: TODAY: BMET and CBC  Testing/Procedures: Ablation  Your physician has recommended that you have an ablation. Catheter ablation is a medical procedure used to treat some cardiac arrhythmias (irregular heartbeats). During catheter ablation, a long, thin, flexible tube is put into a blood vessel in your groin (upper thigh), or neck. This tube is called an ablation catheter. It is then guided to your heart through the blood vessel. Radio frequency waves destroy small areas of heart tissue where abnormal heartbeats may cause an arrhythmia to start. Please see the instruction sheet given to you today.   Follow-Up: At [facility], you and your health needs are our priority.  As part of our continuing mission to provide you with exceptional heart care, our providers are all part of one team.  This team includes your primary Cardiologist (physician) and Advanced Practice Providers or APPs (Physician Assistants and Nurse Practitioners) who all work together to provide you with the care you need, when you need it.  Your next appointment:   We will contact you to schedule your post-procedure appointments

## 2024-09-05 LAB — BASIC METABOLIC PANEL WITH GFR
BUN/Creatinine Ratio: 14 (ref 10–24)
BUN: 14 mg/dL (ref 8–27)
CO2: 23 mmol/L (ref 20–29)
Calcium: 9.3 mg/dL (ref 8.6–10.2)
Chloride: 105 mmol/L (ref 96–106)
Creatinine, Ser: 1.01 mg/dL (ref 0.76–1.27)
Glucose: 85 mg/dL (ref 70–99)
Potassium: 4.5 mmol/L (ref 3.5–5.2)
Sodium: 141 mmol/L (ref 134–144)
eGFR: 80 mL/min/1.73 (ref >59)

## 2024-09-05 LAB — CBC
Hematocrit: 40.3 % (ref 37.5–51.0)
Hemoglobin: 13 g/dL (ref 13.0–17.7)
MCH: 31.4 pg (ref 26.6–33.0)
MCHC: 32.3 g/dL (ref 31.5–35.7)
MCV: 97 fL (ref 79–97)
Platelets: 203 x10E3/uL (ref 150–450)
RBC: 4.14 x10E6/uL (ref 4.14–5.80)
RDW: 12 % (ref 11.6–15.4)
WBC: 4.8 x10E3/uL (ref 3.4–10.8)

## 2024-09-09 ENCOUNTER — Ambulatory Visit: Payer: Self-pay | Admitting: Cardiology

## 2024-09-12 ENCOUNTER — Encounter (HOSPITAL_COMMUNITY): Payer: Self-pay

## 2024-10-02 NOTE — Pre-Procedure Instructions (Signed)
 Spoke with patient's daughter Stephane.  Reviewed the following instructions:   Arrival time 0930. New time Nothing to eat or drink after midnight No meds AM of procedure Responsible person to drive you home and stay with you for 24 hrs  Have you missed any doses of anti-coagulant Eliquis - takes twice a day, hasn't missed any doses.  Don't take dose morning of procedure.

## 2024-10-03 ENCOUNTER — Other Ambulatory Visit: Payer: Self-pay

## 2024-10-03 ENCOUNTER — Ambulatory Visit (HOSPITAL_COMMUNITY): Admitting: Certified Registered Nurse Anesthetist

## 2024-10-03 ENCOUNTER — Ambulatory Visit (HOSPITAL_COMMUNITY)
Admission: RE | Admit: 2024-10-03 | Discharge: 2024-10-03 | Disposition: A | Discharge status: Home / Self Care | Attending: Cardiology | Admitting: Cardiology

## 2024-10-03 ENCOUNTER — Other Ambulatory Visit (HOSPITAL_COMMUNITY): Payer: Self-pay

## 2024-10-03 ENCOUNTER — Ambulatory Visit (HOSPITAL_COMMUNITY)
Admission: RE | Disposition: A | Discharge status: Home / Self Care | Payer: Self-pay | Source: Home / Self Care | Attending: Cardiology

## 2024-10-03 DIAGNOSIS — I10 Essential (primary) hypertension: Secondary | ICD-10-CM | POA: Diagnosis not present

## 2024-10-03 DIAGNOSIS — Z7901 Long term (current) use of anticoagulants: Secondary | ICD-10-CM | POA: Insufficient documentation

## 2024-10-03 DIAGNOSIS — E785 Hyperlipidemia, unspecified: Secondary | ICD-10-CM | POA: Insufficient documentation

## 2024-10-03 DIAGNOSIS — Z87891 Personal history of nicotine dependence: Secondary | ICD-10-CM | POA: Insufficient documentation

## 2024-10-03 DIAGNOSIS — I480 Paroxysmal atrial fibrillation: Secondary | ICD-10-CM

## 2024-10-03 DIAGNOSIS — D6869 Other thrombophilia: Secondary | ICD-10-CM | POA: Insufficient documentation

## 2024-10-03 DIAGNOSIS — Z79899 Other long term (current) drug therapy: Secondary | ICD-10-CM | POA: Diagnosis not present

## 2024-10-03 DIAGNOSIS — I4719 Other supraventricular tachycardia: Secondary | ICD-10-CM | POA: Diagnosis not present

## 2024-10-03 DIAGNOSIS — I2510 Atherosclerotic heart disease of native coronary artery without angina pectoris: Secondary | ICD-10-CM | POA: Diagnosis not present

## 2024-10-03 LAB — POCT ACTIVATED CLOTTING TIME
Activated Clotting Time: 256 s
Activated Clotting Time: 302 s

## 2024-10-03 SURGERY — ATRIAL FIBRILLATION ABLATION
Anesthesia: General

## 2024-10-03 MED ORDER — FENTANYL CITRATE (PF) 250 MCG/5ML IJ SOLN
INTRAMUSCULAR | Status: DC | PRN
  Administered 2024-10-03: 50 ug via INTRAVENOUS

## 2024-10-03 MED ORDER — HEPARIN SODIUM (PORCINE) 1000 UNIT/ML IJ SOLN
INTRAMUSCULAR | Status: AC
  Filled 2024-10-03: qty 10

## 2024-10-03 MED ORDER — HEPARIN SODIUM (PORCINE) 1000 UNIT/ML IJ SOLN
INTRAMUSCULAR | Status: DC | PRN
  Administered 2024-10-03: 6000 [IU] via INTRAVENOUS
  Administered 2024-10-03: 14000 [IU] via INTRAVENOUS

## 2024-10-03 MED ORDER — PHENYLEPHRINE HCL-NACL 20-0.9 MG/250ML-% IV SOLN
INTRAVENOUS | Status: DC | PRN
  Administered 2024-10-03: 25 ug/min via INTRAVENOUS

## 2024-10-03 MED ORDER — HEPARIN SODIUM (PORCINE) 1000 UNIT/ML IJ SOLN
INTRAMUSCULAR | Status: DC | PRN
  Administered 2024-10-03: 1000 [IU] via INTRAVENOUS

## 2024-10-03 MED ORDER — ROCURONIUM BROMIDE 10 MG/ML (PF) SYRINGE
PREFILLED_SYRINGE | INTRAVENOUS | Status: DC | PRN
  Administered 2024-10-03: 60 mg via INTRAVENOUS
  Administered 2024-10-03: 20 mg via INTRAVENOUS
  Administered 2024-10-03: 10 mg via INTRAVENOUS

## 2024-10-03 MED ORDER — HEPARIN (PORCINE) IN NACL 1000-0.9 UT/500ML-% IV SOLN
INTRAVENOUS | Status: DC | PRN
  Administered 2024-10-03 (×3): 500 mL

## 2024-10-03 MED ORDER — ONDANSETRON HCL 4 MG/2ML IJ SOLN
INTRAMUSCULAR | Status: DC | PRN
  Administered 2024-10-03: 4 mg via INTRAVENOUS

## 2024-10-03 MED ORDER — ONDANSETRON HCL 4 MG/2ML IJ SOLN: 4.0000 mg | Freq: Four times a day (QID) | INTRAMUSCULAR | Status: DC | PRN

## 2024-10-03 MED ORDER — SODIUM CHLORIDE 0.9 % IV SOLN: INTRAVENOUS | Status: DC

## 2024-10-03 MED ORDER — SODIUM CHLORIDE 0.9 % IV SOLN: 250.0000 mL | INTRAVENOUS | Status: DC | PRN

## 2024-10-03 MED ORDER — LIDOCAINE 2% (20 MG/ML) 5 ML SYRINGE
INTRAMUSCULAR | Status: DC | PRN
  Administered 2024-10-03: 100 mg via INTRAVENOUS

## 2024-10-03 MED ORDER — SODIUM CHLORIDE 0.9% FLUSH
3.0000 mL | Freq: Two times a day (BID) | INTRAVENOUS | Status: DC
Start: 2024-10-03 — End: 2024-10-04

## 2024-10-03 MED ORDER — APIXABAN 5 MG PO TABS
ORAL_TABLET | ORAL | Status: AC
  Administered 2024-10-03: 5 mg
  Filled 2024-10-03: qty 1

## 2024-10-03 MED ORDER — APIXABAN 5 MG PO TABS
5.0000 mg | ORAL_TABLET | Freq: Once | ORAL | Status: DC
  Filled 2024-10-03: qty 1

## 2024-10-03 MED ORDER — PROTAMINE SULFATE 10 MG/ML IV SOLN
INTRAVENOUS | Status: DC | PRN
  Administered 2024-10-03: 5 mg via INTRAVENOUS
  Administered 2024-10-03: 30 mg via INTRAVENOUS

## 2024-10-03 MED ORDER — SODIUM CHLORIDE 0.9% FLUSH: 3.0000 mL | INTRAVENOUS | Status: DC | PRN

## 2024-10-03 MED ORDER — PROPOFOL 10 MG/ML IV BOLUS
INTRAVENOUS | Status: DC | PRN
  Administered 2024-10-03: 30 mg via INTRAVENOUS
  Administered 2024-10-03: 100 mg via INTRAVENOUS

## 2024-10-03 MED ORDER — ATROPINE SULFATE 1 MG/10ML IJ SOSY
PREFILLED_SYRINGE | INTRAMUSCULAR | Status: DC | PRN
Start: 2024-10-03 — End: 2024-10-03
  Administered 2024-10-03: 1 mg via INTRAVENOUS

## 2024-10-03 MED ORDER — ACETAMINOPHEN 325 MG PO TABS: 650.0000 mg | ORAL_TABLET | ORAL | Status: DC | PRN

## 2024-10-03 MED ORDER — SUGAMMADEX SODIUM 200 MG/2ML IV SOLN
INTRAVENOUS | Status: DC | PRN
  Administered 2024-10-03: 200 mg via INTRAVENOUS

## 2024-10-03 MED ORDER — PHENYLEPHRINE 80 MCG/ML (10ML) SYRINGE FOR IV PUSH (FOR BLOOD PRESSURE SUPPORT)
PREFILLED_SYRINGE | INTRAVENOUS | Status: DC | PRN
  Administered 2024-10-03: 160 ug via INTRAVENOUS
  Administered 2024-10-03 (×2): 80 ug via INTRAVENOUS
  Administered 2024-10-03: 120 ug via INTRAVENOUS

## 2024-10-03 MED ORDER — FENTANYL CITRATE (PF) 100 MCG/2ML IJ SOLN
INTRAMUSCULAR | Status: AC
  Filled 2024-10-03: qty 2

## 2024-10-03 SURGICAL SUPPLY — 22 items
BLANKET WARM UNDERBOD FULL ACC (MISCELLANEOUS) ×1 IMPLANT
CABLE FARASTAR GEN2 SNGL USE (CABLE) IMPLANT
CATH BI DIR 7FR CS F-J 12 PIN (CATHETERS) IMPLANT
CATH FARAWAVE 2.0 35 (CATHETERS) IMPLANT
CATH GE 8FR SOUNDSTAR (CATHETERS) IMPLANT
CATH OCTARAY 2.0 F 3-3-3-3-3 (CATHETERS) IMPLANT
CATH SMTCH THERMOCOOL SF DF (CATHETERS) IMPLANT
CLOSURE PERCLOSE PROSTYLE (Vascular Products) IMPLANT
COVER SWIFTLINK CONNECTOR (BAG) ×1 IMPLANT
DEVICE CLOSURE MYNXGRIP 6/7F (Vascular Products) IMPLANT
DILATOR VESSEL 38 20CM 16FR (INTRODUCER) IMPLANT
GUIDEWIRE INQWIRE 1.5J.035X260 (WIRE) IMPLANT
KIT VERSACROSS CNCT FARADRIVE (KITS) IMPLANT
PACK EP LF (CUSTOM PROCEDURE TRAY) ×1 IMPLANT
PAD DEFIB RADIO PHYSIO CONN (PAD) ×1 IMPLANT
PATCH CARTO3 (PAD) IMPLANT
SHEATH FARADRIVE STEERABLE (SHEATH) IMPLANT
SHEATH PINNACLE 8F 10CM (SHEATH) IMPLANT
SHEATH PINNACLE 9F 10CM (SHEATH) IMPLANT
SHEATH PROBE COVER 6X72 (BAG) IMPLANT
TUBING SMART ABLATE COOLFLOW (TUBING) IMPLANT
WIRE HI TORQ VERSACORE-J 145CM (WIRE) IMPLANT

## 2024-10-03 NOTE — Anesthesia Preprocedure Evaluation (Addendum)
 Anesthesia Evaluation  Patient identified by MRN, date of birth, ID band Patient awake    Reviewed: NPO status , Patient's Chart, lab work & pertinent test results, reviewed documented beta blocker date and time   History of Anesthesia Complications Negative for: history of anesthetic complications  Airway Mallampati: II  TM Distance: >3 FB Neck ROM: Full    Dental  (+) Edentulous Upper, Edentulous Lower   Pulmonary former smoker   breath sounds clear to auscultation       Cardiovascular hypertension, Pt. on medications + CAD  + dysrhythmias Atrial Fibrillation  Rhythm:Regular Rate:Bradycardia  TTE (2023):  1. Left ventricular ejection fraction, by estimation, is 60 to 65%. The  left ventricle has normal function. The left ventricle has no regional  wall motion abnormalities. Left ventricular diastolic parameters are  consistent with Grade I diastolic  dysfunction (impaired relaxation). Elevated left ventricular end-diastolic  pressure. The average left ventricular global longitudinal strain is -20.6  %. The global longitudinal strain is normal.   2. Right ventricular systolic function is normal. The right ventricular  size is normal. There is mildly elevated pulmonary artery systolic  pressure.   3. The mitral valve is normal in structure. Trivial mitral valve  regurgitation. No evidence of mitral stenosis.   4. The aortic valve is tricuspid. Aortic valve regurgitation is trivial.  No aortic stenosis is present.   5. Aortic dilatation noted. There is mild dilatation of the aortic root,  measuring 42 mm. There is mild dilatation of the ascending aorta,  measuring 39 mm.   6. The inferior vena cava is dilated in size with <50% respiratory  variability, suggesting right atrial pressure of 15 mmHg.     Neuro/Psych    GI/Hepatic ,neg GERD  ,,  Endo/Other    Renal/GU      Musculoskeletal   Abdominal   Peds   Hematology   Anesthesia Other Findings   Reproductive/Obstetrics                              Anesthesia Physical Anesthesia Plan  ASA: 3  Anesthesia Plan: General   Post-op Pain Management:    Induction: Intravenous  PONV Risk Score and Plan: 1  Airway Management Planned: Oral ETT  Additional Equipment:   Intra-op Plan:   Post-operative Plan: Extubation in OR  Informed Consent:      Dental advisory given  Plan Discussed with: Surgeon  Anesthesia Plan Comments:          Anesthesia Quick Evaluation

## 2024-10-03 NOTE — Anesthesia Procedure Notes (Addendum)
 Procedure Name: Intubation Date/Time: 10/03/2024 1:10 PM  Performed by: Mannie Krystal LABOR, CRNAPre-anesthesia Checklist: Patient identified, Emergency Drugs available, Suction available and Patient being monitored Patient Re-evaluated:Patient Re-evaluated prior to induction Oxygen Delivery Method: Circle system utilized Preoxygenation: Pre-oxygenation with 100% oxygen Induction Type: IV induction Ventilation: Mask ventilation without difficulty Laryngoscope Size: Miller and 3 Grade View: Grade I Tube type: Oral Tube size: 7.0 mm Number of attempts: 1 Airway Equipment and Method: Stylet and Oral airway Placement Confirmation: ETT inserted through vocal cords under direct vision, positive ETCO2 and breath sounds checked- equal and bilateral Secured at: 23 cm Tube secured with: Tape Dental Injury: Teeth and Oropharynx as per pre-operative assessment

## 2024-10-03 NOTE — Progress Notes (Signed)
 Patient ambulated in the hallway and to the bathroom and was able to void. Bilateral groin dressings clean, dry, and intact. No hematoma or bleeding noted to sites. No dizziness or chest pain noted. Blood pressure stable. OK to discharge per provider.

## 2024-10-03 NOTE — Discharge Instructions (Signed)

## 2024-10-03 NOTE — Transfer of Care (Signed)
 Immediate Anesthesia Transfer of Care Note  Patient: Darren Pugh  Procedure(s) Performed: ATRIAL FIBRILLATION ABLATION  Patient Location: PACU and Cath Lab  Anesthesia Type:General  Level of Consciousness: awake, alert , and oriented  Airway & Oxygen Therapy: Patient Spontanous Breathing and Patient connected to face mask oxygen  Post-op Assessment: Report given to RN and Post -op Vital signs reviewed and stable  Post vital signs: Reviewed and stable  Last Vitals:  Vitals Value Taken Time  BP 98/62 10/03/24 16:00  Temp 37.1 C 10/03/24 15:59  Pulse 63 10/03/24 16:03  Resp 12 10/03/24 16:03  SpO2 96 % 10/03/24 16:03  Vitals shown include unfiled device data.  Last Pain:  Vitals:   10/03/24 1559  TempSrc: Oral  PainSc: 0-No pain         Complications: There were no known notable events for this encounter.

## 2024-10-03 NOTE — H&P (Signed)
 Electrophysiology Note:   Date:  10/03/24  ID:  Darren Pugh, DOB [birth year], MRN [id number]   Primary Cardiologist: [name], MD Electrophysiologist: Fonda Kitty, MD       History of Present Illness:   Darren Pugh is a 73 y.o. male with h/o hyperlipidemia and hypertension seen today for follow up evaluation of paroxysmal atrial fibrillation.   Discussed the use of AI scribe software for clinical note transcription with the patient, who gave verbal consent to proceed.   History of Present Illness Darren Pugh is a 73 year old male with atrial fibrillation who presents for follow-up regarding his condition and scheduled ablation procedure. He was initially referred by Dr. Wendel approximately 1 year ago for evaluation of atrial fibrillation.   Since our last visit, he has experienced approximately eight episodes of atrial fibrillation over the past year, leading to four emergency room visits where he was treated and released after a few hours. During these episodes, he experiences a racing heart rate that does not resolve spontaneously.   He has been using Eliquis  intermittently during episodes of atrial fibrillation but reports feeling a lack of energy while on the medication. Additionally, he takes metoprolol  twice daily to manage his condition.   When not in atrial fibrillation he is otherwise doing relatively well and has no new or acute complaints today.  Interval: Patient presents today for planned catheter ablation. Started Eliquis . Missed a dose early after starting but patient and his daughter deny missing any the past few weeks. He is in sinus rhythm today. No new or acute complaints.   Review of systems complete and found to be negative unless listed in HPI.    EP Information / Studies Reviewed:        EKG 08/28/23: AF    Echo 11/02/22:  LVEF 60-65%. G1DD.  Normal RV size and function.  No significant valvular disease.  Ascending aorta dilatation,  39mm. 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    Risk Assessment/Calculations:     CHA2DS2-VASc Score = 2   This indicates a 2.2% annual risk of stroke. The patient's score is based upon: CHF History: 0 HTN History: 1 Diabetes History: 0 Stroke History: 0 Vascular Disease History: 0 Age Score: 1 Gender Score: 0         Physical Exam:    Today's Vitals   10/03/24 1025  BP: (!) 144/83  Pulse: (!) 50  Temp: 97.7 F (36.5 C)  SpO2: 100%  Weight: 83.9 kg  Height: 6' 2 (1.88 m)  PainSc: 0-No pain   Body mass index is 23.75 kg/m.   General: Well developed, in no acute distress.  Neck: No JVD.  Cardiac: Bradycardic, regular rhythm.  Resp: Normal work of breathing.  Ext: No edema.  Neuro: No gross focal deficits.  Psych: Normal affect.    ASSESSMENT AND PLAN:     #Paroxysmal atrial fibrillation: Symptomatic.  Resulting in multiple ED visits. - Continue metoprolol  12.5mg  BID for now.  -Discussed treatment options today for AF including antiarrhythmic drug therapy and ablation. Discussed risks, recovery and likelihood of success with each treatment strategy. Risk, benefits, and alternatives to EP study and ablation for afib were discussed. These risks include but are not limited to stroke, bleeding, vascular damage, tamponade, perforation, damage to the esophagus, lungs, phrenic nerve and other structures, pulmonary vein stenosis, worsening renal function, coronary vasospasm and death.  Discussed potential need for repeat ablation procedures and antiarrhythmic drugs after an initial ablation. The  patient understands these risk and wishes to proceed today.    #Secondary hypercoagulable state due to atrial fibrillation:  -CHADSVASC score of 2.  -Continue Eliquis  5mg  BID.  He has a 90 days supply at home. Missed a dose or 2 shortly after starting but patient and daughter deny any missed doses in past few weeks. Emphasized compliance with daily twice daily dosing for the next 60-90 days  after ablation due to increased stroke risk.  Given his low CHA2DS2-VASc, if he does not have recurrence after ablation, it would be reasonable to discontinue as long as we are monitoring closely for recurrence.    Signed, Fonda Kitty, MD

## 2024-10-03 NOTE — Progress Notes (Signed)
 SBP mid 90's. Skin warm dry and pink. Easily awakened. Dr. Janifer at bedside. Bilateral groin sites level 0.

## 2024-10-04 ENCOUNTER — Telehealth (HOSPITAL_COMMUNITY): Payer: Self-pay

## 2024-10-04 NOTE — Anesthesia Postprocedure Evaluation (Signed)
 Anesthesia Post Note  Patient: Darren Pugh  Procedure(s) Performed: ATRIAL FIBRILLATION ABLATION     Patient location during evaluation: Other Anesthesia Type: General Level of consciousness: awake Pain management: pain level controlled Vital Signs Assessment: post-procedure vital signs reviewed and stable Respiratory status: spontaneous breathing Cardiovascular status: blood pressure returned to baseline Postop Assessment: no apparent nausea or vomiting Anesthetic complications: no   There were no known notable events for this encounter.                Lauraine KATHEE [place]

## 2024-10-04 NOTE — Telephone Encounter (Signed)
 Spoke with patient's daughter, Stephane, to complete post procedure follow up call. She reports no complications with groin sites.   Instructions reviewed:  Remove large bandage at puncture site after 24 hours. It is normal to have bruising, tenderness, mild swelling, and a pea or marble sized lump/knot at the groin site which can take up to three months to resolve.  Get help right away if you notice sudden swelling at the puncture site.  Check your puncture site every day for signs of infection: fever, redness, swelling, pus drainage, warmth, foul odor or excessive pain. If this occurs, please call [phone], to speak with the RN Navigator. Get help right away if your puncture site is bleeding and the bleeding does not stop after applying firm pressure to the area.  You may continue to have skipped beats/ atrial fibrillation during the first several months after your procedure.  It is very important not to miss any doses of your blood thinner Eliquis .    You will follow up with the Afib clinic 4 weeks after your procedure and follow up with the APP 3 months after your procedure.  Activity restrictions reviewed.  Dawn verbalized understanding to all instructions provided.

## 2024-10-05 ENCOUNTER — Encounter (HOSPITAL_COMMUNITY): Payer: Self-pay | Admitting: Cardiology

## 2024-10-05 MED FILL — Fentanyl Citrate Preservative Free (PF) Inj 100 MCG/2ML: INTRAMUSCULAR | Qty: 1 | Status: AC

## 2024-11-08 ENCOUNTER — Ambulatory Visit (HOSPITAL_COMMUNITY)
Admission: RE | Admit: 2024-11-08 | Discharge: 2024-11-08 | Disposition: A | Source: Ambulatory Visit | Attending: Physician Assistant | Admitting: Physician Assistant

## 2024-11-08 VITALS — BP 132/80 | HR 61 | Ht 74.0 in | Wt 185.6 lb

## 2024-11-08 DIAGNOSIS — I4891 Unspecified atrial fibrillation: Secondary | ICD-10-CM | POA: Diagnosis not present

## 2024-11-08 DIAGNOSIS — D6869 Other thrombophilia: Secondary | ICD-10-CM | POA: Diagnosis not present

## 2024-11-08 DIAGNOSIS — I480 Paroxysmal atrial fibrillation: Secondary | ICD-10-CM | POA: Diagnosis not present

## 2024-11-08 MED ORDER — RIVAROXABAN 20 MG PO TABS: 20.0000 mg | ORAL_TABLET | Freq: Every day | ORAL | 11 refills | Status: DC

## 2024-11-08 NOTE — Progress Notes (Signed)
 Primary Care Physician: Emilio Joesph DEL, PA-C Primary Cardiologist: Lurena MARLA Red, MD Electrophysiologist: Fonda Kitty, MD  Referring Physician: Dr Kitty Aleene FORBES Darren Pugh is a 73 y.o. male with a history of HLD, HTN, SVT, atrial fibrillation who presents for follow up in the [facility]. Patient presented to ED on 08/28/23 with a chief complaint of dizziness and lightheadedness. Also with palpitations. Was found to be in atrial fibrillation with RVR. He spontaneously converted in the ED. He thinks episode got worse after having COVID infection. He was seen by Dr Kitty and underwent afib and SVT ablation on 10/03/24. Patient is on Eliquis  for stroke prevention.    Patient presents today for follow up for atrial fibrillation. He remains in SR today and feels well. He reports his groin cath sites have healed. He self stopped Eliquis  because it made him feel awful.   Today, he denies symptoms of palpitations, chest pain, shortness of breath, orthopnea, PND, lower extremity edema, dizziness, presyncope, syncope, snoring, daytime somnolence, bleeding, or neurologic sequela. The patient is tolerating medications without difficulties and is otherwise without complaint today.    Atrial Fibrillation Risk Factors:  he does not have symptoms or diagnosis of sleep apnea. he does not have a history of rheumatic fever. he does have a history of alcohol use. 6 beers daily The patient does not have a history of early familial atrial fibrillation or other arrhythmias.  Atrial Fibrillation Management history:  Previous antiarrhythmic drugs: none Previous cardioversions: none Previous ablations: 10/03/24 Anticoagulation history: Eliquis   ROS- All systems are reviewed and negative except as per the HPI above.  Past Medical History:  Diagnosis Date   Allergy    History of hyperlipidemia    PAF (paroxysmal atrial fibrillation) (HCC) 2009    Current Outpatient  Medications  Medication Sig Dispense Refill   Ascorbic Acid (VITAMIN C PO) Take 1 tablet by mouth daily as needed. (Patient taking differently: Take 1 tablet by mouth as needed.)     aspirin EC 81 MG tablet Take 81 mg by mouth daily. Swallow whole.     atorvastatin  (LIPITOR) 20 MG tablet Take 1 tablet (20 mg total) by mouth daily. (Patient taking differently: Take 20 mg by mouth as needed.) 90 tablet 3   Cholecalciferol (VITAMIN D-3 PO) Take 10 mcg by mouth once a week. (Patient taking differently: Take 10 mcg by mouth as needed.)     Homeopathic Products (ZICAM COLD REMEDY PO) Take 1 each by mouth daily as needed. (Patient taking differently: Take 1 each by mouth as needed.)     MAGNESIUM PO Take 1 tablet by mouth daily as needed. (Patient taking differently: Take 1 tablet by mouth as needed.)     OVER THE COUNTER MEDICATION Take 1 tablet by mouth daily as needed. Potassium(99mg )/Magnesium(150mg ) combo (Patient taking differently: Take 1 tablet by mouth as needed. Potassium(99mg )/Magnesium(150mg ) combo)     OVER THE COUNTER MEDICATION Take 1 each by mouth daily as needed. Mullein flower     OVER THE COUNTER MEDICATION Take 1 mL by mouth at bedtime. CBD, CBG, CBC, CBN, D9, and HHC Cannabis product (Patient taking differently: Take 1 mL by mouth as needed. CBD, CBG, CBC, CBN, D9, and HHC Cannabis product)     vitamin B-12 (CYANOCOBALAMIN) 500 MCG tablet Take 500 mcg by mouth daily. (Patient taking differently: Take 500 mcg by mouth as needed.)     ZINC-VITAMIN C PO Take 1 tablet by mouth daily as needed. (  Patient taking differently: Take 1 tablet by mouth as needed.)     apixaban  (ELIQUIS ) 5 MG TABS tablet Take 1 tablet (5 mg total) by mouth 2 (two) times daily. TAKE 1 TABLET(5 MG) BY MOUTH TWICE DAILY (Patient not taking: Reported on 11/08/2024) 180 tablet 3   losartan  (COZAAR ) 25 MG tablet Take 1 tablet (25 mg total) by mouth daily. (Patient not taking: Reported on 11/08/2024) 90 tablet 3   metoprolol   tartrate (LOPRESSOR ) 25 MG tablet TAKE 1 TABLET(25 MG) BY MOUTH TWICE DAILY AS NEEDED (Patient not taking: Reported on 11/08/2024) 180 tablet 3   No current facility-administered medications for this encounter.    Physical Exam: BP 132/80   Pulse 61   Ht 6' 2 (1.88 m)   Wt 84.2 kg   BMI 23.83 kg/m   GEN: Well nourished, well developed in no acute distress CARDIAC: Regular rate and rhythm, no murmurs, rubs, gallops RESPIRATORY:  Clear to auscultation without rales, wheezing or rhonchi  ABDOMEN: Soft, non-tender, non-distended EXTREMITIES:  No edema; No deformity   Wt Readings from Last 3 Encounters:  11/08/24 84.2 kg  10/03/24 83.9 kg  09/04/24 84.4 kg     EKG Interpretation Date/Time:  Thursday November 08 2024 11:16:28 EST Ventricular Rate:  61 PR Interval:  178 QRS Duration:  84 QT Interval:  408 QTC Calculation: 410 R Axis:   42  Text Interpretation: Sinus rhythm with Premature atrial complexes with Abberant conduction Cannot rule out Anterior infarct , age undetermined Abnormal ECG When compared with ECG of 03-Oct-2024 16:14, No significant change was found Confirmed by Rilley Poulter ([id number]) on 11/08/2024 11:24:58 AM    Echo 11/02/22 demonstrated   1. Left ventricular ejection fraction, by estimation, is 60 to 65%. The  left ventricle has normal function. The left ventricle has no regional  wall motion abnormalities. Left ventricular diastolic parameters are  consistent with Grade I diastolic dysfunction (impaired relaxation). Elevated left ventricular end-diastolic pressure. The average left ventricular global longitudinal strain is -20.6 %. The global longitudinal strain is normal.   2. Right ventricular systolic function is normal. The right ventricular  size is normal. There is mildly elevated pulmonary artery systolic  pressure.   3. The mitral valve is normal in structure. Trivial mitral valve  regurgitation. No evidence of mitral stenosis.   4. The aortic valve  is tricuspid. Aortic valve regurgitation is trivial.  No aortic stenosis is present.   5. Aortic dilatation noted. There is mild dilatation of the aortic root,  measuring 42 mm. There is mild dilatation of the ascending aorta,  measuring 39 mm.   6. The inferior vena cava is dilated in size with <50% respiratory  variability, suggesting right atrial pressure of 15 mmHg.    CHA2DS2-VASc Score = 3  The patient's score is based upon: CHF History: 0 HTN History: 1 Diabetes History: 0 Stroke History: 0 Vascular Disease History: 1 Age Score: 1 Gender Score: 0       ASSESSMENT AND PLAN: Paroxysmal Atrial Fibrillation (ICD10:  I48.0) The patient's CHA2DS2-VASc score is 3, indicating a 3.2% annual risk of stroke.   S/p afib ablation 10/03/24 Patient appears to be maintaining SR Stressed importance of anticoagulation post ablation and his increased risk of stroke. Will start Xarelto 20 mg daily. Stop ASA. Continue Lopressor  25 mg BID PRN for heart racing.   Secondary Hypercoagulable State (ICD10:  D68.69) The patient is at significant risk for stroke/thromboembolism based upon his CHA2DS2-VASc Score of 3.  Start  Rivaroxaban (Xarelto).   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      [facility] [address] [place], [place] [address] [phone]

## 2025-01-03 ENCOUNTER — Encounter: Payer: Self-pay | Admitting: Student

## 2025-01-03 ENCOUNTER — Ambulatory Visit: Admitting: Student

## 2025-01-03 VITALS — BP 134/80 | HR 57 | Ht 74.0 in | Wt 189.0 lb

## 2025-01-03 DIAGNOSIS — I480 Paroxysmal atrial fibrillation: Secondary | ICD-10-CM | POA: Diagnosis not present

## 2025-01-03 DIAGNOSIS — D6869 Other thrombophilia: Secondary | ICD-10-CM

## 2025-01-03 DIAGNOSIS — I4891 Unspecified atrial fibrillation: Secondary | ICD-10-CM

## 2025-01-03 NOTE — Progress Notes (Signed)
" °  Electrophysiology Office Note:   Date:  01/03/2025  ID:  Darren Pugh, DOB 09-24-1951, MRN [id number]  Primary Cardiologist: Lurena MARLA Red, MD Electrophysiologist: Fonda Kitty, MD   Electrophysiologist:  Fonda Kitty, MD      History of Present Illness:   Darren Pugh is a 74 y.o. male with h/o HLD, HTN, SVT, and atrial fibrillation seen today for routine electrophysiology followup.   Since last being seen in our clinic the patient reports doing OK. He has had brief palpitations, but none sustained since his procedure. He is a difficult conversation with flights of subjects.  He is not currently taking any medication, and prefers not to. Currently, he denies chest pain, dyspnea, PND, orthopnea, nausea, vomiting, dizziness, syncope, edema, weight gain, or early satiety.   Review of systems complete and found to be negative unless listed in HPI.   EP Information / Studies Reviewed:    EKG is ordered today. Personal review as below.  EKG Interpretation Date/Time:  Thursday January 03 2025 11:30:30 EST Ventricular Rate:  57 PR Interval:  176 QRS Duration:  84 QT Interval:  412 QTC Calculation: 401 R Axis:   47  Text Interpretation: Sinus bradycardia When compared with ECG of 08-Nov-2024 11:16, Abberant conduction is no longer Present Confirmed by Lesia Heck ([id number]) on 01/03/2025 11:31:51 AM    Arrhythmia/Device History S/p PVI and AVNRT ablation 10/03/2024   Physical Exam:   VS:  BP 134/80   Pulse (!) 57   Ht 6' 2 (1.88 m)   Wt 189 lb (85.7 kg)   SpO2 99%   BMI 24.27 kg/m    Wt Readings from Last 3 Encounters:  01/03/25 189 lb (85.7 kg)  11/08/24 185 lb 9.6 oz (84.2 kg)  10/03/24 185 lb (83.9 kg)     GEN: No acute distress NECK: No JVD; No carotid bruits CARDIAC: Regular rate and rhythm, no murmurs, rubs, gallops RESPIRATORY:  Clear to auscultation without rales, wheezing or rhonchi  ABDOMEN: Soft, non-tender, non-distended EXTREMITIES:  No edema; No  deformity   ASSESSMENT AND PLAN:    Paroxysmal AF Secondary hypercoagulable state  EKG today shows NSR Refuses OAC for CHA2DS2VASc of at least 3  Long discussion for stroke risk and importance of OAC with daughter present. Pt declines medication at this time, but does say he will take a baby ASA.  CAD No s/s of ischemia.      HTN Stable on current regimen   SVT  S/p ablation, no recurrence  Follow up with EP Team in 6 months, at which point can see EP as needed and continue gen cards follow up.  Overdue for gen cards follow up.   Signed, Ozell Prentice Lesia, PA-C  "

## 2025-01-03 NOTE — Patient Instructions (Signed)
 Medication Instructions:  No medication changes today. *If you need a refill on your cardiac medications before your next appointment, please call your pharmacy*  Lab Work: No labwork ordered today. If you have labs (blood work) drawn today and your tests are completely normal, you will receive your results only by: MyChart Message (if you have MyChart) OR A paper copy in the mail If you have any lab test that is abnormal or we need to change your treatment, we will call you to review the results.  Testing/Procedures: No testing ordered today  Follow-Up: At [facility], you and your health needs are our priority.  As part of our continuing mission to provide you with exceptional heart care, our providers are all part of one team.  This team includes your primary Cardiologist (physician) and Advanced Practice Providers or APPs (Physician Assistants and Nurse Practitioners) who all work together to provide you with the care you need, when you need it.  Your next appointment:   6 month(s) Overdue for Gen cards follow up  Provider:   You may see Fonda Kitty, MD or one of the following Advanced Practice Providers on your designated Care Team:   Charlies Arthur, PA-C Cassey Bacigalupo Andy Lelia Jons, PA-C Suzann Riddle, NP Daphne Barrack, NP    We recommend signing up for the patient portal called MyChart.  Sign up information is provided on this After Visit Summary.  MyChart is used to connect with patients for Virtual Visits (Telemedicine).  Patients are able to view lab/test results, encounter notes, upcoming appointments, etc.  Non-urgent messages can be sent to your provider as well.   To learn more about what you can do with MyChart, go to [email or web address].

## 2025-01-25 ENCOUNTER — Ambulatory Visit: Admitting: Student

## 2025-02-07 ENCOUNTER — Ambulatory Visit: Admitting: Internal Medicine
